# Patient Record
Sex: Male | Born: 1940 | Race: White | Hispanic: No | Marital: Married | Smoking: Former smoker
Health system: Southern US, Community
[De-identification: ages and names within clinical notes are randomized; demographics above are authoritative.]

## PROBLEM LIST (undated history)

## (undated) ENCOUNTER — Emergency Department: Admission: EM | Disposition: A | Discharge status: Home / Self Care | Payer: Medicare Other

## (undated) DIAGNOSIS — I10 Essential (primary) hypertension: Secondary | ICD-10-CM

## (undated) DIAGNOSIS — I509 Heart failure, unspecified: Secondary | ICD-10-CM

## (undated) DIAGNOSIS — J449 Chronic obstructive pulmonary disease, unspecified: Secondary | ICD-10-CM

## (undated) DIAGNOSIS — E119 Type 2 diabetes mellitus without complications: Secondary | ICD-10-CM

## (undated) DIAGNOSIS — N289 Disorder of kidney and ureter, unspecified: Secondary | ICD-10-CM

## (undated) DIAGNOSIS — E785 Hyperlipidemia, unspecified: Secondary | ICD-10-CM

## (undated) HISTORY — PX: CATARACT EXTRACTION: SUR2

## (undated) HISTORY — PX: WRIST SURGERY: SHX841

## (undated) HISTORY — PX: ROTATOR CUFF REPAIR: SHX139

## (undated) HISTORY — PX: HAND SURGERY: SHX662

## (undated) HISTORY — PX: CORONARY STENT PLACEMENT: SHX1402

## (undated) HISTORY — PX: REPLACEMENT TOTAL KNEE: SUR1224

---

## 2009-10-29 ENCOUNTER — Inpatient Hospital Stay: Admission: AD | Admit: 2009-10-29 | Discharge: 2009-11-02 | Payer: Self-pay | Admitting: Internal Medicine

## 2009-11-02 ENCOUNTER — Emergency Department (HOSPITAL_COMMUNITY): Admission: EM | Admit: 2009-11-02 | Discharge: 2009-11-02 | Payer: Self-pay | Admitting: Emergency Medicine

## 2010-10-06 LAB — URINALYSIS, ROUTINE W REFLEX MICROSCOPIC
Bilirubin Urine: NEGATIVE
Glucose, UA: NEGATIVE mg/dL
Ketones, ur: NEGATIVE mg/dL
Leukocytes, UA: NEGATIVE
Nitrite: NEGATIVE
Specific Gravity, Urine: 1.02 (ref 1.005–1.030)
Urobilinogen, UA: 0.2 mg/dL (ref 0.0–1.0)
pH: 6 (ref 5.0–8.0)

## 2010-10-06 LAB — COMPREHENSIVE METABOLIC PANEL
BUN: 35 mg/dL — ABNORMAL HIGH (ref 6–23)
CO2: 27 mEq/L (ref 19–32)
Chloride: 106 mEq/L (ref 96–112)
Creatinine, Ser: 3.13 mg/dL — ABNORMAL HIGH (ref 0.4–1.5)
GFR calc Af Amer: 24 mL/min — ABNORMAL LOW (ref >60)
GFR calc non Af Amer: 20 mL/min — ABNORMAL LOW (ref >60)
Potassium: 4.6 mEq/L (ref 3.5–5.1)
Sodium: 139 mEq/L (ref 135–145)
Total Protein: 7.5 g/dL (ref 6.0–8.3)

## 2010-10-06 LAB — CBC
HCT: 27.4 % — ABNORMAL LOW (ref 39.0–52.0)
MCHC: 34.8 g/dL (ref 30.0–36.0)
Platelets: 269 10*3/uL (ref 150–400)
RDW: 14.8 % (ref 11.5–15.5)

## 2010-10-06 LAB — CULTURE, BLOOD (ROUTINE X 2)
Culture: NO GROWTH
Report Status: 4222011

## 2010-10-06 LAB — GLUCOSE, CAPILLARY
Glucose-Capillary: 111 mg/dL — ABNORMAL HIGH (ref 70–99)
Glucose-Capillary: 138 mg/dL — ABNORMAL HIGH (ref 70–99)
Glucose-Capillary: 155 mg/dL — ABNORMAL HIGH (ref 70–99)
Glucose-Capillary: 155 mg/dL — ABNORMAL HIGH (ref 70–99)

## 2010-10-06 LAB — POCT CARDIAC MARKERS
Myoglobin, poc: >500 ng/mL (ref 12–200)
Troponin i, poc: <0.05 ng/mL (ref 0.00–0.09)
Troponin i, poc: <0.05 ng/mL (ref 0.00–0.09)

## 2010-10-06 LAB — DIFFERENTIAL
Basophils Absolute: 0.1 10*3/uL (ref 0.0–0.1)
Eosinophils Absolute: 0.3 10*3/uL (ref 0.0–0.7)
Lymphocytes Relative: 9 % — ABNORMAL LOW (ref 12–46)
Lymphs Abs: 0.8 10*3/uL (ref 0.7–4.0)
Monocytes Absolute: 0.8 10*3/uL (ref 0.1–1.0)
Neutrophils Relative %: 77 % (ref 43–77)

## 2010-10-06 LAB — URINE CULTURE: Colony Count: 10000

## 2010-10-06 LAB — BRAIN NATRIURETIC PEPTIDE: Pro B Natriuretic peptide (BNP): 358 pg/mL — ABNORMAL HIGH (ref 0.0–100.0)

## 2010-10-06 LAB — URINE MICROSCOPIC-ADD ON

## 2010-10-07 LAB — GLUCOSE, CAPILLARY: Glucose-Capillary: 154 mg/dL — ABNORMAL HIGH (ref 70–99)

## 2011-05-03 IMAGING — NM NM PULM PERFUSION & VENT (REBREATHING & WASHOUT)
2 series · 12 of 12 positions shown · non-contrast
Comparison: Chest radiography same day

CLINICAL DATA: Short of breath.  Elevated D-dimer.

NUCLEAR MEDICINE VENTILATION - PERFUSION LUNG SCAN
TECHNIQUE: Wash-in, equilibrium, and wash-out phase ventilation
images were obtained using 7e-VSS gas.  Perfusion images were
obtained in multiple projections after intravenous injection of Tc-
99m MAA.
Radiopharmaceuticals:  11.9 mCi 7e-VSS gas and 5.5 mCi Lc-IIm MAA.

[Series 1: lung vq · 3.20mm/px · 6 of 16 frames shown (1 of 2)]
[frame 2/16  full-range]
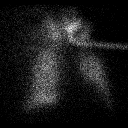
[frame 4/16  full-range]
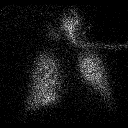
[frame 7/16  full-range]
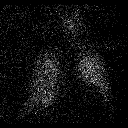
[frame 10/16]
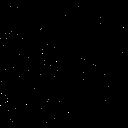
[frame 12/16  full-range]
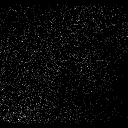
[frame 15/16]
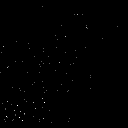

[Series 1: lung vq · 3.20mm/px · 6 of 16 frames shown (2 of 2)]
[frame 2/16  full-range]
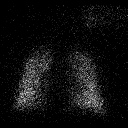
[frame 4/16  full-range]
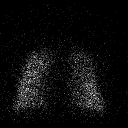
[frame 7/16]
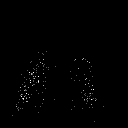
[frame 10/16]
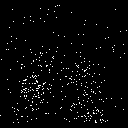
[frame 12/16]
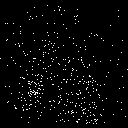
[frame 15/16]
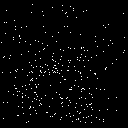

[12 of 12 positions shown; findings below may reference images not displayed]

FINDINGS: No air trapping.  There are no frank perfusion defects.
There is a minimal patchy pattern in the perihilar regions,
consistent with the areas of infiltrate.
IMPRESSION: Low probability of pulmonary embolism.  No frank defects.  Minimal
patchy pattern in the perihilar regions, matching areas of patchy
infiltrate at radiography.

## 2014-11-05 ENCOUNTER — Inpatient Hospital Stay (HOSPITAL_COMMUNITY)
Admission: EM | Admit: 2014-11-05 | Discharge: 2014-11-09 | DRG: 186 | Disposition: A | Discharge status: Home / Self Care | Payer: Medicare Other | Attending: Internal Medicine | Admitting: Internal Medicine

## 2014-11-05 ENCOUNTER — Emergency Department (HOSPITAL_COMMUNITY): Payer: Medicare Other

## 2014-11-05 ENCOUNTER — Encounter (HOSPITAL_COMMUNITY): Payer: Self-pay | Admitting: Emergency Medicine

## 2014-11-05 DIAGNOSIS — J441 Chronic obstructive pulmonary disease with (acute) exacerbation: Secondary | ICD-10-CM | POA: Diagnosis present

## 2014-11-05 DIAGNOSIS — Z87891 Personal history of nicotine dependence: Secondary | ICD-10-CM

## 2014-11-05 DIAGNOSIS — Z96652 Presence of left artificial knee joint: Secondary | ICD-10-CM | POA: Diagnosis present

## 2014-11-05 DIAGNOSIS — E1140 Type 2 diabetes mellitus with diabetic neuropathy, unspecified: Secondary | ICD-10-CM | POA: Diagnosis present

## 2014-11-05 DIAGNOSIS — J90 Pleural effusion, not elsewhere classified: Secondary | ICD-10-CM | POA: Diagnosis not present

## 2014-11-05 DIAGNOSIS — N186 End stage renal disease: Secondary | ICD-10-CM | POA: Diagnosis present

## 2014-11-05 DIAGNOSIS — I120 Hypertensive chronic kidney disease with stage 5 chronic kidney disease or end stage renal disease: Secondary | ICD-10-CM | POA: Diagnosis present

## 2014-11-05 DIAGNOSIS — E785 Hyperlipidemia, unspecified: Secondary | ICD-10-CM | POA: Diagnosis present

## 2014-11-05 DIAGNOSIS — M4802 Spinal stenosis, cervical region: Secondary | ICD-10-CM | POA: Diagnosis present

## 2014-11-05 DIAGNOSIS — F329 Major depressive disorder, single episode, unspecified: Secondary | ICD-10-CM | POA: Diagnosis present

## 2014-11-05 DIAGNOSIS — E211 Secondary hyperparathyroidism, not elsewhere classified: Secondary | ICD-10-CM | POA: Diagnosis present

## 2014-11-05 DIAGNOSIS — J9621 Acute and chronic respiratory failure with hypoxia: Secondary | ICD-10-CM | POA: Diagnosis present

## 2014-11-05 DIAGNOSIS — Z9849 Cataract extraction status, unspecified eye: Secondary | ICD-10-CM

## 2014-11-05 DIAGNOSIS — Z7901 Long term (current) use of anticoagulants: Secondary | ICD-10-CM

## 2014-11-05 DIAGNOSIS — Z992 Dependence on renal dialysis: Secondary | ICD-10-CM

## 2014-11-05 DIAGNOSIS — L97509 Non-pressure chronic ulcer of other part of unspecified foot with unspecified severity: Secondary | ICD-10-CM | POA: Diagnosis present

## 2014-11-05 DIAGNOSIS — R0602 Shortness of breath: Secondary | ICD-10-CM | POA: Diagnosis present

## 2014-11-05 DIAGNOSIS — D638 Anemia in other chronic diseases classified elsewhere: Secondary | ICD-10-CM | POA: Diagnosis present

## 2014-11-05 DIAGNOSIS — Z888 Allergy status to other drugs, medicaments and biological substances status: Secondary | ICD-10-CM

## 2014-11-05 DIAGNOSIS — I509 Heart failure, unspecified: Secondary | ICD-10-CM | POA: Diagnosis present

## 2014-11-05 DIAGNOSIS — Z9861 Coronary angioplasty status: Secondary | ICD-10-CM

## 2014-11-05 DIAGNOSIS — K219 Gastro-esophageal reflux disease without esophagitis: Secondary | ICD-10-CM | POA: Diagnosis present

## 2014-11-05 DIAGNOSIS — Z9981 Dependence on supplemental oxygen: Secondary | ICD-10-CM

## 2014-11-05 DIAGNOSIS — E8770 Fluid overload, unspecified: Secondary | ICD-10-CM | POA: Diagnosis present

## 2014-11-05 DIAGNOSIS — J9620 Acute and chronic respiratory failure, unspecified whether with hypoxia or hypercapnia: Secondary | ICD-10-CM

## 2014-11-05 DIAGNOSIS — E669 Obesity, unspecified: Secondary | ICD-10-CM | POA: Diagnosis present

## 2014-11-05 DIAGNOSIS — I878 Other specified disorders of veins: Secondary | ICD-10-CM | POA: Diagnosis present

## 2014-11-05 DIAGNOSIS — E119 Type 2 diabetes mellitus without complications: Secondary | ICD-10-CM

## 2014-11-05 DIAGNOSIS — E1121 Type 2 diabetes mellitus with diabetic nephropathy: Secondary | ICD-10-CM | POA: Diagnosis present

## 2014-11-05 DIAGNOSIS — Z9889 Other specified postprocedural states: Secondary | ICD-10-CM

## 2014-11-05 DIAGNOSIS — Z794 Long term (current) use of insulin: Secondary | ICD-10-CM

## 2014-11-05 HISTORY — DX: Type 2 diabetes mellitus without complications: E11.9

## 2014-11-05 HISTORY — DX: Heart failure, unspecified: I50.9

## 2014-11-05 HISTORY — DX: Hyperlipidemia, unspecified: E78.5

## 2014-11-05 HISTORY — DX: Disorder of kidney and ureter, unspecified: N28.9

## 2014-11-05 HISTORY — DX: Essential (primary) hypertension: I10

## 2014-11-05 HISTORY — DX: Chronic obstructive pulmonary disease, unspecified: J44.9

## 2014-11-05 NOTE — ED Notes (Signed)
Pt is from home, pt reports he was not able to complete dialysis yesterday but was suppose to return today but was not able to get a ride to go today. Pt reports SOB, administered breathing treatments at home but did not have relief. Pt is suppose to wear 2L 02 at home always, pt is not compliant.

## 2014-11-05 NOTE — ED Provider Notes (Signed)
CSN: [id number]     Arrival date & time 11/05/14  2333 History  This chart was scribed for Loren Racer, MD by Bronson Curb, ED Scribe. This patient was seen in room D31C/D31C and the patient's care was started at 12:00 AM.   Chief Complaint  Patient presents with  . Shortness of Breath    The history is provided by the patient. No language interpreter was used.     HPI Comments: Adam Gates is a 74 y.o. male, with history of DM, renal disorder, and COPD, who presents to the Emergency Department complaining of increased SOB for the past 2 days. Patient states he went to dialysis 2 days ago, however, he was not able to undergo the full treatment due to increased cramping and reports he "left 8 pounds up". He states he was supposed to return yesterday to continue treatmentbut did not have transportation. He reports increased SOB when lying flat and with physical exertion. There is chronic swelling to the BLE. He denies any new pain at this time. He reports history of irregular heartbeat for several years. He denies abdominal pain. Patient suspects his symptoms are related to increased fluid and is requesting dialysis treatment.  PCP: Deforest Hoyles, MD    Past Medical History  Diagnosis Date  . COPD (chronic obstructive pulmonary disease)   . Diabetes mellitus without complication   . Renal disorder   . Hypertension   . Hyperlipidemia   . CHF (congestive heart failure)    Past Surgical History  Procedure Laterality Date  . Hand surgery    . Replacement total knee Left   . Coronary stent placement    . Rotator cuff repair Left   . Cataract extraction    . Wrist surgery Right    No family history on file. History  Substance Use Topics  . Smoking status: Former Games developer  . Smokeless tobacco: Not on file  . Alcohol Use: No    Review of Systems  Constitutional: Negative for fever and chills.  Respiratory: Positive for shortness of breath. Negative for cough and wheezing.    Cardiovascular: Positive for leg swelling. Negative for chest pain and palpitations.  Gastrointestinal: Negative for nausea, vomiting, abdominal pain, diarrhea and constipation.  Musculoskeletal: Negative for myalgias, back pain, neck pain and neck stiffness.  Skin: Negative for rash and wound.  Neurological: Negative for dizziness, weakness, light-headedness, numbness and headaches.  All other systems reviewed and are negative.     Allergies  Ambien; Gabapentin; Lunesta; and Norvasc  Home Medications   Prior to Admission medications   Medication Sig Start Date End Date Taking? Authorizing Provider  albuterol (PROVENTIL) (2.5 MG/3ML) 0.083% nebulizer solution Take 2.5 mg by nebulization 4 (four) times daily.   Yes Historical Provider, MD  carvedilol (COREG) 6.25 MG tablet Take 6.25 mg by mouth 2 (two) times daily with a meal.   Yes Historical Provider, MD  donepezil (ARICEPT) 10 MG tablet Take 10 mg by mouth at bedtime.   Yes Historical Provider, MD  HYDROcodone-acetaminophen (NORCO) 10-325 MG per tablet Take 1 tablet by mouth every 4 (four) hours as needed for moderate pain.   Yes Historical Provider, MD  insulin NPH-regular Human (NOVOLIN 70/30) (70-30) 100 UNIT/ML injection Inject 0-60 Units into the skin 2 (two) times daily.   Yes Historical Provider, MD  morphine (MSIR) 30 MG tablet Take 30-60 mg by mouth every 4 (four) hours as needed for severe pain.   Yes Historical Provider, MD  omeprazole (PRILOSEC) 40  MG capsule Take 40 mg by mouth every morning.   Yes Historical Provider, MD  sertraline (ZOLOFT) 50 MG tablet Take 50 mg by mouth daily.   Yes Historical Provider, MD   Triage Vitals: BP 158/84 mmHg  Temp(Src) 98.1 F (36.7 C) (Oral)  Resp 22  Ht  (1.753 m)  Wt 260 lb (117.935 kg)  BMI 38.38 kg/m2  SpO2 100%  Physical Exam  Constitutional: He is oriented to person, place, and time. He appears well-developed and well-nourished. No distress.  HENT:  Head:  Normocephalic and atraumatic.  Mouth/Throat: Oropharynx is clear and moist.  Eyes: Conjunctivae and EOM are normal. Pupils are equal, round, and reactive to light.  Neck: Normal range of motion. Neck supple. No tracheal deviation present.  Cardiovascular: Normal rate and regular rhythm.   Pulmonary/Chest: Effort normal and breath sounds normal. No respiratory distress. He has no wheezes. He has no rales.  Decreased breath sounds bilateral bases with scattered rhonchi.  Abdominal: Soft. Bowel sounds are normal. He exhibits no distension and no mass. There is no tenderness. There is no rebound and no guarding.  Musculoskeletal: Normal range of motion. He exhibits no edema or tenderness.  Palpable thrill and right upper extremity AV fistula. Bilateral chronic appearing lower extremity edema.  Neurological: He is alert and oriented to person, place, and time.  Moves all extremities without deficit.  Skin: Skin is warm and dry. No rash noted. No erythema.  Psychiatric: He has a normal mood and affect. His behavior is normal.  Nursing note and vitals reviewed.   ED Course  Procedures (including critical care time)  DIAGNOSTIC STUDIES: Oxygen Saturation is 100% on 2L/min [place], normal by my interpretation.    COORDINATION OF CARE: At 0006 Discussed treatment plan with patient which includes imaging and dialysis. Patient agrees.   Labs Review Labs Reviewed  COMPREHENSIVE METABOLIC PANEL - Abnormal; Notable for the following:    Sodium 134 (*)    Glucose, Bld 156 (*)    BUN 52 (*)    Creatinine, Ser 6.83 (*)    Albumin 3.2 (*)    GFR calc non Af Amer 7 (*)    GFR calc Af Amer 8 (*)    All other components within normal limits  BRAIN NATRIURETIC PEPTIDE - Abnormal; Notable for the following:    B Natriuretic Peptide 1230.8 (*)    All other components within normal limits  CBC WITH DIFFERENTIAL/PLATELET - Abnormal; Notable for the following:    RBC 3.06 (*)    Hemoglobin 8.6 (*)    HCT  27.6 (*)    Platelets 136 (*)    Neutrophils Relative % 85 (*)    Neutro Abs 7.9 (*)    Lymphocytes Relative 7 (*)    Lymphs Abs 0.6 (*)    All other components within normal limits  I-STAT TROPOININ, ED    Imaging Review Ct Chest W Contrast  11/06/2014   CLINICAL DATA:  Increase shortness of breath. Dialysis which has been missed over the past few days.  EXAM: CT CHEST WITH CONTRAST  TECHNIQUE: Multidetector CT imaging of the chest was performed during intravenous contrast administration.  CONTRAST:  75mL OMNIPAQUE IOHEXOL 300 MG/ML  SOLN  COMPARISON:  Chest x-ray from 1 day prior  FINDINGS: THORACIC INLET/BODY WALL:  No acute abnormality.  MEDIASTINUM:  Mild cardiomegaly. No pericardial effusion. There is extensive atherosclerosis, including the coronary arteries. Moderate aortic valvular calcifications/sclerosis. No acute vascular findings. No suspicious lymph nodes. Negative esophagus.  LUNG WINDOWS:  There is a moderate left pleural effusion which is loculated along the lateral wall. Septations are faintly visible posteriorly and there is mild, intermittent pleural thickening. There is a bandlike opacity in the lower lingula with volume loss and segmental airway opacification. No definitive pneumonia. There is a trace right pleural effusion which is layering.  UPPER ABDOMEN:  No acute findings.  OSSEOUS:  No acute fracture.  No suspicious lytic or blastic lesions.  IMPRESSION: 1. Moderate, loculated and partially septated left pleural effusion. No definitive cause identified, consider sampling. 2. Inferior lingula atelectasis with segmental airway opacification or obstruction. No mass is seen, but close CT follow-up is recommend given #1. 3. Moderate aortic valve calcifications/sclerosis.   Electronically Signed   By: Marnee SpringJonathon  Watts M.D.   On: 11/06/2014 02:24   Dg Chest Port 1 View  11/06/2014   CLINICAL DATA:  Shortness of breath.  EXAM: PORTABLE CHEST - 1 VIEW  COMPARISON:  05/31/2014   FINDINGS: There is a moderate left pleural effusion which does not appear free-flowing.  Chronic cardiomegaly. No pulmonary edema in the non obscured lungs. No pneumothorax.  IMPRESSION: 1. Moderate left pleural effusion which appears partly loculated. 2. Chronic cardiomegaly.   Electronically Signed   By: Marnee SpringJonathon  Watts M.D.   On: 11/06/2014 00:10      EKG Interpretation   Date/Time:  Tuesday November 05 2014 23:45:09 EDT Ventricular Rate:  87 PR Interval:    QRS Duration: 106 QT Interval:  386 QTC Calculation: 464 R Axis:   -99 Text Interpretation:  Atrial fibrillation Incomplete RBBB and LAFB  Anterior infarct, old Confirmed by Ranae PalmsYELVERTON  MD, Konner Saiz ([id number]) on  11/06/2014 12:21:46 AM      MDM   Final diagnoses:  SOB (shortness of breath)    I personally performed the services described in this documentation, which was scribed in my presence. The recorded information has been reviewed and is accurate.  Discussed with Hospitalist and will admit.   Loren Raceravid Covey Baller, MD 11/06/14 [phone]

## 2014-11-06 ENCOUNTER — Inpatient Hospital Stay (HOSPITAL_COMMUNITY): Payer: Medicare Other

## 2014-11-06 ENCOUNTER — Emergency Department (HOSPITAL_COMMUNITY): Payer: Medicare Other

## 2014-11-06 ENCOUNTER — Encounter (HOSPITAL_COMMUNITY): Payer: Self-pay | Admitting: Emergency Medicine

## 2014-11-06 DIAGNOSIS — E119 Type 2 diabetes mellitus without complications: Secondary | ICD-10-CM

## 2014-11-06 DIAGNOSIS — I120 Hypertensive chronic kidney disease with stage 5 chronic kidney disease or end stage renal disease: Secondary | ICD-10-CM | POA: Diagnosis present

## 2014-11-06 DIAGNOSIS — Z9849 Cataract extraction status, unspecified eye: Secondary | ICD-10-CM | POA: Diagnosis not present

## 2014-11-06 DIAGNOSIS — I878 Other specified disorders of veins: Secondary | ICD-10-CM | POA: Diagnosis present

## 2014-11-06 DIAGNOSIS — N189 Chronic kidney disease, unspecified: Secondary | ICD-10-CM

## 2014-11-06 DIAGNOSIS — Z992 Dependence on renal dialysis: Secondary | ICD-10-CM | POA: Diagnosis not present

## 2014-11-06 DIAGNOSIS — E785 Hyperlipidemia, unspecified: Secondary | ICD-10-CM | POA: Diagnosis present

## 2014-11-06 DIAGNOSIS — E8779 Other fluid overload: Secondary | ICD-10-CM

## 2014-11-06 DIAGNOSIS — K219 Gastro-esophageal reflux disease without esophagitis: Secondary | ICD-10-CM | POA: Diagnosis present

## 2014-11-06 DIAGNOSIS — R0602 Shortness of breath: Secondary | ICD-10-CM | POA: Diagnosis present

## 2014-11-06 DIAGNOSIS — G894 Chronic pain syndrome: Secondary | ICD-10-CM | POA: Diagnosis not present

## 2014-11-06 DIAGNOSIS — Z87891 Personal history of nicotine dependence: Secondary | ICD-10-CM | POA: Diagnosis not present

## 2014-11-06 DIAGNOSIS — Z96652 Presence of left artificial knee joint: Secondary | ICD-10-CM | POA: Diagnosis present

## 2014-11-06 DIAGNOSIS — E1169 Type 2 diabetes mellitus with other specified complication: Secondary | ICD-10-CM | POA: Diagnosis not present

## 2014-11-06 DIAGNOSIS — J90 Pleural effusion, not elsewhere classified: Secondary | ICD-10-CM | POA: Diagnosis present

## 2014-11-06 DIAGNOSIS — E8770 Fluid overload, unspecified: Secondary | ICD-10-CM | POA: Diagnosis present

## 2014-11-06 DIAGNOSIS — Z888 Allergy status to other drugs, medicaments and biological substances status: Secondary | ICD-10-CM | POA: Diagnosis not present

## 2014-11-06 DIAGNOSIS — E1121 Type 2 diabetes mellitus with diabetic nephropathy: Secondary | ICD-10-CM | POA: Diagnosis present

## 2014-11-06 DIAGNOSIS — L97509 Non-pressure chronic ulcer of other part of unspecified foot with unspecified severity: Secondary | ICD-10-CM | POA: Diagnosis present

## 2014-11-06 DIAGNOSIS — E11319 Type 2 diabetes mellitus with unspecified diabetic retinopathy without macular edema: Secondary | ICD-10-CM | POA: Diagnosis not present

## 2014-11-06 DIAGNOSIS — Z9981 Dependence on supplemental oxygen: Secondary | ICD-10-CM | POA: Diagnosis not present

## 2014-11-06 DIAGNOSIS — Z7901 Long term (current) use of anticoagulants: Secondary | ICD-10-CM | POA: Diagnosis not present

## 2014-11-06 DIAGNOSIS — E1129 Type 2 diabetes mellitus with other diabetic kidney complication: Secondary | ICD-10-CM

## 2014-11-06 DIAGNOSIS — E1140 Type 2 diabetes mellitus with diabetic neuropathy, unspecified: Secondary | ICD-10-CM | POA: Diagnosis present

## 2014-11-06 DIAGNOSIS — M4802 Spinal stenosis, cervical region: Secondary | ICD-10-CM | POA: Diagnosis present

## 2014-11-06 DIAGNOSIS — J441 Chronic obstructive pulmonary disease with (acute) exacerbation: Secondary | ICD-10-CM | POA: Diagnosis present

## 2014-11-06 DIAGNOSIS — J9621 Acute and chronic respiratory failure with hypoxia: Secondary | ICD-10-CM | POA: Diagnosis present

## 2014-11-06 DIAGNOSIS — Z794 Long term (current) use of insulin: Secondary | ICD-10-CM | POA: Diagnosis not present

## 2014-11-06 DIAGNOSIS — I509 Heart failure, unspecified: Secondary | ICD-10-CM | POA: Diagnosis present

## 2014-11-06 DIAGNOSIS — E669 Obesity, unspecified: Secondary | ICD-10-CM | POA: Diagnosis present

## 2014-11-06 DIAGNOSIS — F329 Major depressive disorder, single episode, unspecified: Secondary | ICD-10-CM | POA: Diagnosis present

## 2014-11-06 DIAGNOSIS — Z9861 Coronary angioplasty status: Secondary | ICD-10-CM | POA: Diagnosis not present

## 2014-11-06 DIAGNOSIS — N186 End stage renal disease: Secondary | ICD-10-CM | POA: Diagnosis present

## 2014-11-06 DIAGNOSIS — D638 Anemia in other chronic diseases classified elsewhere: Secondary | ICD-10-CM | POA: Diagnosis present

## 2014-11-06 DIAGNOSIS — E1122 Type 2 diabetes mellitus with diabetic chronic kidney disease: Secondary | ICD-10-CM

## 2014-11-06 DIAGNOSIS — E211 Secondary hyperparathyroidism, not elsewhere classified: Secondary | ICD-10-CM | POA: Diagnosis present

## 2014-11-06 LAB — BODY FLUID CELL COUNT WITH DIFFERENTIAL
EOS FL: NONE SEEN %
LYMPHS FL: 58 %
Monocyte-Macrophage-Serous Fluid: 7 % — ABNORMAL LOW (ref 50–90)
Neutrophil Count, Fluid: 35 % — ABNORMAL HIGH (ref 0–25)
Total Nucleated Cell Count, Fluid: 642 cu mm (ref 0–1000)

## 2014-11-06 LAB — CBC WITH DIFFERENTIAL/PLATELET
BASOS PCT: 0 % (ref 0–1)
Basophils Absolute: 0 10*3/uL (ref 0.0–0.1)
Eosinophils Absolute: 0.1 10*3/uL (ref 0.0–0.7)
Eosinophils Relative: 1 % (ref 0–5)
HEMATOCRIT: 27.6 % — AB (ref 39.0–52.0)
Hemoglobin: 8.6 g/dL — ABNORMAL LOW (ref 13.0–17.0)
Lymphocytes Relative: 7 % — ABNORMAL LOW (ref 12–46)
Lymphs Abs: 0.6 10*3/uL — ABNORMAL LOW (ref 0.7–4.0)
MCH: 28.1 pg (ref 26.0–34.0)
MCHC: 31.2 g/dL (ref 30.0–36.0)
MCV: 90.2 fL (ref 78.0–100.0)
MONO ABS: 0.6 10*3/uL (ref 0.1–1.0)
MONOS PCT: 7 % (ref 3–12)
NEUTROS ABS: 7.9 10*3/uL — AB (ref 1.7–7.7)
Neutrophils Relative %: 85 % — ABNORMAL HIGH (ref 43–77)
Platelets: 136 10*3/uL — ABNORMAL LOW (ref 150–400)
RBC: 3.06 MIL/uL — ABNORMAL LOW (ref 4.22–5.81)
RDW: 15.4 % (ref 11.5–15.5)
WBC: 9.1 10*3/uL (ref 4.0–10.5)

## 2014-11-06 LAB — PROTEIN, BODY FLUID: Total protein, fluid: 3.5 g/dL

## 2014-11-06 LAB — I-STAT TROPONIN, ED: Troponin i, poc: 0.05 ng/mL (ref 0.00–0.08)

## 2014-11-06 LAB — COMPREHENSIVE METABOLIC PANEL
ALBUMIN: 3.2 g/dL — AB (ref 3.5–5.2)
ALT: 14 U/L (ref 0–53)
ANION GAP: 13 (ref 5–15)
AST: 18 U/L (ref 0–37)
Alkaline Phosphatase: 58 U/L (ref 39–117)
BILIRUBIN TOTAL: 0.5 mg/dL (ref 0.3–1.2)
BUN: 52 mg/dL — AB (ref 6–23)
CO2: 21 mmol/L (ref 19–32)
CREATININE: 6.83 mg/dL — AB (ref 0.50–1.35)
Calcium: 8.7 mg/dL (ref 8.4–10.5)
Chloride: 100 mmol/L (ref 96–112)
GFR calc non Af Amer: 7 mL/min — ABNORMAL LOW (ref >90)
GFR, EST AFRICAN AMERICAN: 8 mL/min — AB (ref >90)
Glucose, Bld: 156 mg/dL — ABNORMAL HIGH (ref 70–99)
Potassium: 4.6 mmol/L (ref 3.5–5.1)
Sodium: 134 mmol/L — ABNORMAL LOW (ref 135–145)
Total Protein: 6.6 g/dL (ref 6.0–8.3)

## 2014-11-06 LAB — GLUCOSE, CAPILLARY
GLUCOSE-CAPILLARY: 120 mg/dL — AB (ref 70–99)
Glucose-Capillary: 119 mg/dL — ABNORMAL HIGH (ref 70–99)
Glucose-Capillary: 90 mg/dL (ref 70–99)

## 2014-11-06 LAB — BRAIN NATRIURETIC PEPTIDE: B NATRIURETIC PEPTIDE 5: 1230.8 pg/mL — AB (ref 0.0–100.0)

## 2014-11-06 LAB — GLUCOSE, SEROUS FLUID: GLUCOSE FL: 107 mg/dL

## 2014-11-06 LAB — PH, BODY FLUID: pH, Fluid: 8

## 2014-11-06 LAB — LACTATE DEHYDROGENASE, PLEURAL OR PERITONEAL FLUID: LD, Fluid: 213 U/L — ABNORMAL HIGH (ref 3–23)

## 2014-11-06 LAB — MRSA PCR SCREENING: MRSA by PCR: NEGATIVE

## 2014-11-06 LAB — AMYLASE, PLEURAL FLUID: AMYLASE, PLEURAL FLUID: 19 U/L

## 2014-11-06 MED ORDER — INSULIN ASPART PROT & ASPART (70-30 MIX) 100 UNIT/ML ~~LOC~~ SUSP
28.0000 [IU] | Freq: Two times a day (BID) | SUBCUTANEOUS | Status: DC
  Filled 2014-11-06: qty 10

## 2014-11-06 MED ORDER — ALBUTEROL SULFATE (2.5 MG/3ML) 0.083% IN NEBU: 2.5000 mg | INHALATION_SOLUTION | Freq: Four times a day (QID) | RESPIRATORY_TRACT | Status: DC

## 2014-11-06 MED ORDER — CARVEDILOL 6.25 MG PO TABS
6.2500 mg | ORAL_TABLET | Freq: Two times a day (BID) | ORAL | Status: DC
  Administered 2014-11-06 – 2014-11-07 (×3): 6.25 mg via ORAL
  Filled 2014-11-06 (×7): qty 1

## 2014-11-06 MED ORDER — HEPARIN SODIUM (PORCINE) 5000 UNIT/ML IJ SOLN
5000.0000 [IU] | Freq: Three times a day (TID) | INTRAMUSCULAR | Status: DC
  Administered 2014-11-06 – 2014-11-09 (×11): 5000 [IU] via SUBCUTANEOUS
  Filled 2014-11-06 (×9): qty 1

## 2014-11-06 MED ORDER — PANTOPRAZOLE SODIUM 40 MG PO TBEC
80.0000 mg | DELAYED_RELEASE_TABLET | Freq: Every day | ORAL | Status: DC
  Administered 2014-11-06 – 2014-11-09 (×4): 80 mg via ORAL
  Filled 2014-11-06 (×4): qty 2

## 2014-11-06 MED ORDER — MORPHINE SULFATE 4 MG/ML IJ SOLN
4.0000 mg | Freq: Once | INTRAMUSCULAR | Status: AC
  Administered 2014-11-06: 4 mg via INTRAVENOUS
  Filled 2014-11-06: qty 1

## 2014-11-06 MED ORDER — HYDROCODONE-ACETAMINOPHEN 10-325 MG PO TABS
1.0000 | ORAL_TABLET | ORAL | Status: DC | PRN
  Administered 2014-11-06 – 2014-11-08 (×4): 1 via ORAL
  Filled 2014-11-06 (×4): qty 1

## 2014-11-06 MED ORDER — DONEPEZIL HCL 10 MG PO TABS
10.0000 mg | ORAL_TABLET | Freq: Every day | ORAL | Status: DC
  Administered 2014-11-06 – 2014-11-08 (×3): 10 mg via ORAL
  Filled 2014-11-06 (×4): qty 1

## 2014-11-06 MED ORDER — CETYLPYRIDINIUM CHLORIDE 0.05 % MT LIQD
7.0000 mL | Freq: Two times a day (BID) | OROMUCOSAL | Status: DC
  Administered 2014-11-06 – 2014-11-09 (×6): 7 mL via OROMUCOSAL

## 2014-11-06 MED ORDER — INSULIN ASPART PROT & ASPART (70-30 MIX) 100 UNIT/ML ~~LOC~~ SUSP
26.0000 [IU] | Freq: Two times a day (BID) | SUBCUTANEOUS | Status: DC
  Administered 2014-11-06 – 2014-11-09 (×5): 26 [IU] via SUBCUTANEOUS
  Filled 2014-11-06: qty 10

## 2014-11-06 MED ORDER — LIDOCAINE HCL (PF) 1 % IJ SOLN
INTRAMUSCULAR | Status: AC
  Filled 2014-11-06: qty 10

## 2014-11-06 MED ORDER — INSULIN ASPART 100 UNIT/ML ~~LOC~~ SOLN: 0.0000 [IU] | Freq: Three times a day (TID) | SUBCUTANEOUS | Status: DC

## 2014-11-06 MED ORDER — IOHEXOL 300 MG/ML  SOLN
75.0000 mL | Freq: Once | INTRAMUSCULAR | Status: AC | PRN
  Administered 2014-11-06: 75 mL via INTRAVENOUS

## 2014-11-06 MED ORDER — MORPHINE SULFATE 15 MG PO TABS
30.0000 mg | ORAL_TABLET | ORAL | Status: DC | PRN
  Administered 2014-11-06 – 2014-11-08 (×7): 30 mg via ORAL
  Administered 2014-11-09: 60 mg via ORAL
  Filled 2014-11-06 (×4): qty 2
  Filled 2014-11-06: qty 4
  Filled 2014-11-06 (×3): qty 2

## 2014-11-06 MED ORDER — ALBUTEROL SULFATE (2.5 MG/3ML) 0.083% IN NEBU
2.5000 mg | INHALATION_SOLUTION | Freq: Four times a day (QID) | RESPIRATORY_TRACT | Status: DC
  Administered 2014-11-06 – 2014-11-09 (×12): 2.5 mg via RESPIRATORY_TRACT
  Filled 2014-11-06 (×13): qty 3

## 2014-11-06 MED ORDER — SERTRALINE HCL 50 MG PO TABS
50.0000 mg | ORAL_TABLET | Freq: Every day | ORAL | Status: DC
  Administered 2014-11-06 – 2014-11-09 (×4): 50 mg via ORAL
  Filled 2014-11-06 (×5): qty 1

## 2014-11-06 NOTE — Progress Notes (Signed)
Utilization review completed. Nazario Russom, RN, BSN. 

## 2014-11-06 NOTE — Progress Notes (Signed)
Patient seen and examined. Admitted after midnight secondary to worsening shortness of breath. Patient reports that his symptom has been present for the last 2 days and worsening. He is approximately 8 pounds up since his last hemodialysis. Chest x-ray demonstrated loculated left pleural effusion and on admission he was very tachypneic and requiring 2 L of oxygen supplementation. On my exam patient was status post thoracentesis(approximately 700 mL of pleural fluid was removed). Patient is feeling better and breathing easier. Please refer to H&P written by Dr. Julian ReilGardner for further info/details on admission.  Plan: -Continue hemodialysis Monday, Wednesday and Friday -follow chest x-ray in am -low sodium/renal diet -will start 70/30 26 units BID, will check A1C and adjust insulin therapy as needed.  Adam Gates, Adam Gates [phone]

## 2014-11-06 NOTE — Procedures (Signed)
Successful US guided left thoracentesis. Yielded 700 ml of dark bloody fluid. Pt tolerated procedure well. No immediate complications.  Specimen was sent for labs. CXR ordered.  Pattricia BossMORGAN, Kratos Ruscitti D PA-C 11/06/2014 10:17 AM

## 2014-11-06 NOTE — Consult Note (Signed)
WOC wound consult note Reason for Consult: MASD and ulcers on the toes. Pt with severe venous stasis apparent based on hemosiderin staining bilaterally. Has some ulcerations but they are dry over the bilateral LE most are toe tip and some are medial on the RLE. He reports that he uses power chair and that he "bumps" his legs a lot.  He has sleeve like protectors for his ankles and calves that he reports he wears when he is up in his chair.  Wound type: Mild MASD at the apex of gluteal skin folds, blanchable  Toe tip ulcers left 2nd and 4th toes Medial malleolar and medial heel right foot Measurement: L 2nd toe: 0.5cm x 0.5cm 0.1cm distal; 0.3cm x0.3cm x0.1cm proximal  L 4th toe: 0.5cm x 1.0cm x 0.1cm  Left lateral heel: 0.5cm x 0.5cm x 0.1cm Left medial malleolar: 0.5cm x 0.5cm x0.1cm Wound bed: toe tips and legs are dry, pink, clean; gluteal cleft pink, blanchable not open Drainage (amount, consistency, odor) none Periwound: severe hemosiderin staining bilateral LEs  Dressing procedure/placement/frequency: No topical care to the toe tips or LE's, leave open to air.  Barrier cream and sacral prophylactic dressing to the gluteal cleft.   Discussed POC with patient and bedside nurse.  Re consult if needed, will not follow at this time. Thanks  Azrielle Springsteen [employer] RN, CWOCN [phone])

## 2014-11-06 NOTE — Progress Notes (Signed)
Pt vomited x 1, resp labored after O2 sat 100% cont pulse ox.  LS wheezes thoughout.  Repositioned pt resp better but still wheezing.  Breathing Tx given @ 0800.  Pt to have US guided thoracentesis. Paged Dr. Leonel RamsayMadera FYI

## 2014-11-06 NOTE — Progress Notes (Signed)
70/30 insulin 28-30 units no scale to go by, paged Dr. Gwenlyn PerkingMadera for order clarification.

## 2014-11-06 NOTE — H&P (Signed)
[facility] History and Physical  Adam Gates WGN:[id number] DOB: January 16, 1941 DOA: 11/05/2014  Referring physician: EDP PCP: Deforest HoylesGENTRY,DANIEL, MD   Chief Complaint: SOB   HPI: Adam Gates is a 74 y.o. male with h/o ESRD, dialysis MWF.  Patient presents to ED with c/o increased SOB from his baseline SOB over the past 2 days.  2 days ago at dialysis he was unable to complete a full session due to cramping and so he "left 8 pounds up".  He was supposed to return yesterday to dialysis he states to finish the treatment but didn't have transportation and so presents to the ED for what he believes is SOB due to fluid overload from dialysis.  Review of Systems: Systems reviewed.  As above, otherwise negative  Past Medical History  Diagnosis Date  . COPD (chronic obstructive pulmonary disease)   . Diabetes mellitus without complication   . Renal disorder   . Hypertension   . Hyperlipidemia   . CHF (congestive heart failure)    Past Surgical History  Procedure Laterality Date  . Hand surgery    . Replacement total knee Left   . Coronary stent placement    . Rotator cuff repair Left   . Cataract extraction    . Wrist surgery Right    Social History:  reports that he has quit smoking. He does not have any smokeless tobacco history on file. He reports that he does not drink alcohol. His drug history is not on file.  Allergies  Allergen Reactions  . Ambien [Zolpidem Tartrate] Other (See Comments)    Mental changes  . Gabapentin Swelling  . Lunesta [Eszopiclone] Other (See Comments)    Mental changes   . Norvasc [Amlodipine Besylate] Swelling    No family history on file.   Prior to Admission medications   Medication Sig Start Date End Date Taking? Authorizing Provider  albuterol (PROVENTIL) (2.5 MG/3ML) 0.083% nebulizer solution Take 2.5 mg by nebulization 4 (four) times daily.   Yes Historical Provider, MD  carvedilol (COREG) 6.25 MG tablet Take 6.25 mg by mouth 2 (two)  times daily with a meal.   Yes Historical Provider, MD  donepezil (ARICEPT) 10 MG tablet Take 10 mg by mouth at bedtime.   Yes Historical Provider, MD  HYDROcodone-acetaminophen (NORCO) 10-325 MG per tablet Take 1 tablet by mouth every 4 (four) hours as needed for moderate pain.   Yes Historical Provider, MD  insulin NPH-regular Human (NOVOLIN 70/30) (70-30) 100 UNIT/ML injection Inject 0-60 Units into the skin 2 (two) times daily.   Yes Historical Provider, MD  morphine (MSIR) 30 MG tablet Take 30-60 mg by mouth every 4 (four) hours as needed for severe pain.   Yes Historical Provider, MD  omeprazole (PRILOSEC) 40 MG capsule Take 40 mg by mouth every morning.   Yes Historical Provider, MD  sertraline (ZOLOFT) 50 MG tablet Take 50 mg by mouth daily.   Yes Historical Provider, MD   Physical Exam: Filed Vitals:   11/06/14 0207  BP: 155/63  Pulse: 80  Temp:   Resp: 18    BP 155/63 mmHg  Pulse 80  Temp(Src) 98.1 F (36.7 C) (Oral)  Resp 18  Ht 5\' 9"  (1.753 m)  Wt 117.935 kg (260 lb)  BMI 38.38 kg/m2  SpO2 99%  General Appearance:    Alert, oriented, no distress, appears stated age  Head:    Normocephalic, atraumatic  Eyes:    PERRL, EOMI, sclera non-icteric  Nose:   Nares without drainage or epistaxis. Mucosa, turbinates normal  Throat:   Moist mucous membranes. Oropharynx without erythema or exudate.  Neck:   Supple. No carotid bruits.  No thyromegaly.  No lymphadenopathy.   Back:     No CVA tenderness, no spinal tenderness  Lungs:     Few scattered rhonichi in right base, breath sounds diminished in left base.  Chest wall:    No tenderness to palpitation  Heart:    Regular rate and rhythm without murmurs, gallops, rubs  Abdomen:     Soft, non-tender, nondistended, normal bowel sounds, no organomegaly  Genitalia:    deferred  Rectal:    deferred  Extremities:   No clubbing, cyanosis, chronic skin changes and edema in BLE.  Pulses:   2+ and symmetric all extremities   Skin:   Skin color, texture, turgor normal, no rashes or lesions  Lymph nodes:   Cervical, supraclavicular, and axillary nodes normal  Neurologic:   CNII-XII intact. Normal strength, sensation and reflexes      throughout    Labs on Admission:  Basic Metabolic Panel:  Recent Labs Lab 11/06/14  NA 134*  K 4.6  CL 100  CO2 21  GLUCOSE 156*  BUN 52*  CREATININE 6.83*  CALCIUM 8.7   Liver Function Tests:  Recent Labs Lab 11/06/14  AST 18  ALT 14  ALKPHOS 58  BILITOT 0.5  PROT 6.6  ALBUMIN 3.2*   No results for input(s): LIPASE, AMYLASE in the last 168 hours. No results for input(s): AMMONIA in the last 168 hours. CBC:  Recent Labs Lab 11/06/14  WBC 9.1  NEUTROABS 7.9*  HGB 8.6*  HCT 27.6*  MCV 90.2  PLT 136*   Cardiac Enzymes: No results for input(s): CKTOTAL, CKMB, CKMBINDEX, TROPONINI in the last 168 hours.  BNP (last 3 results) No results for input(s): PROBNP in the last 8760 hours. CBG: No results for input(s): GLUCAP in the last 168 hours.  Radiological Exams on Admission: Ct Chest W Contrast  11/06/2014   CLINICAL DATA:  Increase shortness of breath. Dialysis which has been missed over the past few days.  EXAM: CT CHEST WITH CONTRAST  TECHNIQUE: Multidetector CT imaging of the chest was performed during intravenous contrast administration.  CONTRAST:  75mL OMNIPAQUE IOHEXOL 300 MG/ML  SOLN  COMPARISON:  Chest x-ray from 1 day prior  FINDINGS: THORACIC INLET/BODY WALL:  No acute abnormality.  MEDIASTINUM:  Mild cardiomegaly. No pericardial effusion. There is extensive atherosclerosis, including the coronary arteries. Moderate aortic valvular calcifications/sclerosis. No acute vascular findings. No suspicious lymph nodes. Negative esophagus.  LUNG WINDOWS:  There is a moderate left pleural effusion which is loculated along the lateral wall. Septations are faintly visible posteriorly and there is mild, intermittent pleural thickening. There is a bandlike opacity  in the lower lingula with volume loss and segmental airway opacification. No definitive pneumonia. There is a trace right pleural effusion which is layering.  UPPER ABDOMEN:  No acute findings.  OSSEOUS:  No acute fracture.  No suspicious lytic or blastic lesions.  IMPRESSION: 1. Moderate, loculated and partially septated left pleural effusion. No definitive cause identified, consider sampling. 2. Inferior lingula atelectasis with segmental airway opacification or obstruction. No mass is seen, but close CT follow-up is recommend given #1. 3. Moderate aortic valve calcifications/sclerosis.   Electronically Signed   By: Marnee Spring M.D.   On: 11/06/2014 02:24   Dg Chest Port 1 View  11/06/2014   CLINICAL DATA:  Shortness of breath.  EXAM: PORTABLE CHEST - 1 VIEW  COMPARISON:  05/31/2014  FINDINGS: There is a moderate left pleural effusion which does not appear free-flowing.  Chronic cardiomegaly. No pulmonary edema in the non obscured lungs. No pneumothorax.  IMPRESSION: 1. Moderate left pleural effusion which appears partly loculated. 2. Chronic cardiomegaly.   Electronically Signed   By: Marnee Spring M.D.   On: 11/06/2014 00:10    EKG: Independently reviewed.  Assessment/Plan Active Problems:   ESRD (end stage renal disease)   Fluid overload   Loculated pleural effusion   DM2 (diabetes mellitus, type 2)   1. Loculated Pleural effusion - L sided loculated pleural effusion, unknown cause from CT scan.  Would appear to be the major cause of his SOB given the lack of obvious pulmonary edema or fluid overload on lungs. 1. Have ordered initial attempt to drain with IR paracentesis and labs to be drawn on the fluid. 2. Patient may end up needing chest tube and even CTS eval. 2. ESRD with fluid overload - called nephrology consult line, patient to dialysis later this morning 3. DM2 - will continue patient on his "sliding scale 70/30" for now, he says he typically takes 28-30 units BID depending on  BGL, but have ordered BGL checks AC/HS, and have ordered diabetic coordinator consult (sliding scale 70/30 is an unusual management strategy).    Code Status: Full Code  Family Communication: Daughter at bedside Disposition Plan: Admit to inpatient   Time spent: 70 min  GARDNER, JARED M. [facility] Pager [phone]  If 7AM-7PM, please contact the day team taking care of the patient [email or web address] Password [email or web address] 11/06/2014, 2:51 AM

## 2014-11-06 NOTE — Progress Notes (Addendum)
Inpatient Diabetes Program Recommendations  AACE/ADA: New Consensus Statement on Inpatient Glycemic Control (2013)  Target Ranges:  Prepandial:   less than 140 mg/dL      Peak postprandial:   less than 180 mg/dL (1-2 hours)      Critically ill patients:  140 - 180 mg/dL     Results for Adam ChamberlainEELE, Daveyon C (MRN [id number]) as of 11/06/2014 08:27  Ref. Range 11/06/2014 00:00  Glucose Latest Ref Range: 70-99 mg/dL 409156 (H)    Admit with: SOB/ Pleural Effusion/ Volume Overload  History: DM, ESRD, COPD, HTN, CHF  Home DM Meds: 70/30 insulin:  28-30 units bidwc  Current DM Orders: 70/30 insulin: 28-30 units bidwc     **Will see patient today to try to clarify home insulin regimen    MD- Please consider the following:  1. Change 70/30 insulin orders to 25 units bidwc (can titrate upward as needed)  2. Start Novolog Moderate SSI tid ac + HS    Addendum 1130: Spoke with patient this AM.  Patient stated he takes anywhere from 20-30 units bid with meals at home for his 70/30 insulin based in what his CBG levels are.  Sees Deforest Hoylesaniel Gentry, MD for insulin management.  Patient could not be more specific about his insulin doses.     Will follow Ambrose FinlandJeannine Johnston Veniamin Kincaid RN, MSN, CDE Diabetes Coordinator Inpatient Diabetes Program Team Pager: [phone] (8a-5p)

## 2014-11-06 NOTE — Progress Notes (Signed)
Called and spoke ED RN to verify if patient will need Resp Isolation Room due to possible shingles on patient's hands per ED MD note.  She will verify with MD and call back.  [place] [employer] MinorKimberly Araya Roel RN-BC, WTA.

## 2014-11-07 ENCOUNTER — Inpatient Hospital Stay (HOSPITAL_COMMUNITY): Payer: Medicare Other

## 2014-11-07 ENCOUNTER — Encounter (HOSPITAL_COMMUNITY): Payer: Self-pay | Admitting: Internal Medicine

## 2014-11-07 DIAGNOSIS — G894 Chronic pain syndrome: Secondary | ICD-10-CM

## 2014-11-07 DIAGNOSIS — J9621 Acute and chronic respiratory failure with hypoxia: Secondary | ICD-10-CM

## 2014-11-07 DIAGNOSIS — J9620 Acute and chronic respiratory failure, unspecified whether with hypoxia or hypercapnia: Secondary | ICD-10-CM

## 2014-11-07 DIAGNOSIS — I10 Essential (primary) hypertension: Secondary | ICD-10-CM

## 2014-11-07 DIAGNOSIS — K219 Gastro-esophageal reflux disease without esophagitis: Secondary | ICD-10-CM

## 2014-11-07 LAB — BASIC METABOLIC PANEL
Anion gap: 15 (ref 5–15)
BUN: 71 mg/dL — ABNORMAL HIGH (ref 6–23)
CALCIUM: 8.8 mg/dL (ref 8.4–10.5)
CO2: 22 mmol/L (ref 19–32)
CREATININE: 7.98 mg/dL — AB (ref 0.50–1.35)
Chloride: 98 mmol/L (ref 96–112)
GFR calc Af Amer: 7 mL/min — ABNORMAL LOW (ref >90)
GFR calc non Af Amer: 6 mL/min — ABNORMAL LOW (ref >90)
Glucose, Bld: 79 mg/dL (ref 70–99)
Potassium: 5.3 mmol/L — ABNORMAL HIGH (ref 3.5–5.1)
Sodium: 135 mmol/L (ref 135–145)

## 2014-11-07 LAB — CBC
HCT: 32.5 % — ABNORMAL LOW (ref 39.0–52.0)
Hemoglobin: 10 g/dL — ABNORMAL LOW (ref 13.0–17.0)
MCH: 27.9 pg (ref 26.0–34.0)
MCHC: 30.8 g/dL (ref 30.0–36.0)
MCV: 90.5 fL (ref 78.0–100.0)
Platelets: UNDETERMINED 10*3/uL (ref 150–400)
RBC: 3.59 MIL/uL — AB (ref 4.22–5.81)
RDW: 15.5 % (ref 11.5–15.5)
WBC: 11.8 10*3/uL — ABNORMAL HIGH (ref 4.0–10.5)

## 2014-11-07 LAB — GLUCOSE, CAPILLARY
GLUCOSE-CAPILLARY: 95 mg/dL (ref 70–99)
GLUCOSE-CAPILLARY: 89 mg/dL (ref 70–99)
Glucose-Capillary: 116 mg/dL — ABNORMAL HIGH (ref 70–99)
Glucose-Capillary: 83 mg/dL (ref 70–99)

## 2014-11-07 LAB — HEPATITIS B SURFACE ANTIGEN: HEP B S AG: NEGATIVE

## 2014-11-07 MED ORDER — DOXERCALCIFEROL 4 MCG/2ML IV SOLN
4.5000 ug | INTRAVENOUS | Status: DC
  Administered 2014-11-08: 4.5 ug via INTRAVENOUS
  Filled 2014-11-07: qty 4

## 2014-11-07 MED ORDER — ALTEPLASE 2 MG IJ SOLR
2.0000 mg | Freq: Once | INTRAMUSCULAR | Status: DC | PRN
  Filled 2014-11-07: qty 2

## 2014-11-07 MED ORDER — NEPRO/CARBSTEADY PO LIQD
237.0000 mL | ORAL | Status: DC | PRN
  Filled 2014-11-07: qty 237

## 2014-11-07 MED ORDER — ALBUTEROL SULFATE (2.5 MG/3ML) 0.083% IN NEBU
2.5000 mg | INHALATION_SOLUTION | Freq: Once | RESPIRATORY_TRACT | Status: AC
  Administered 2014-11-07: 2.5 mg via RESPIRATORY_TRACT

## 2014-11-07 MED ORDER — ALBUTEROL SULFATE (2.5 MG/3ML) 0.083% IN NEBU: 2.5000 mg | INHALATION_SOLUTION | RESPIRATORY_TRACT | Status: DC | PRN

## 2014-11-07 MED ORDER — DARBEPOETIN ALFA 100 MCG/0.5ML IJ SOSY: 100.0000 ug | PREFILLED_SYRINGE | INTRAMUSCULAR | Status: DC

## 2014-11-07 MED ORDER — LIDOCAINE HCL (PF) 1 % IJ SOLN: 5.0000 mL | INTRAMUSCULAR | Status: DC | PRN

## 2014-11-07 MED ORDER — SODIUM CHLORIDE 0.9 % IV SOLN
125.0000 mg | Freq: Once | INTRAVENOUS | Status: DC
  Filled 2014-11-07: qty 10

## 2014-11-07 MED ORDER — DOXERCALCIFEROL 4 MCG/2ML IV SOLN: 4.5000 ug | Freq: Once | INTRAVENOUS | Status: DC

## 2014-11-07 MED ORDER — ALBUTEROL SULFATE (2.5 MG/3ML) 0.083% IN NEBU
INHALATION_SOLUTION | RESPIRATORY_TRACT | Status: AC
  Filled 2014-11-07: qty 3

## 2014-11-07 MED ORDER — PENTAFLUOROPROP-TETRAFLUOROETH EX AERO: 1.0000 "application " | INHALATION_SPRAY | CUTANEOUS | Status: DC | PRN

## 2014-11-07 MED ORDER — HEPARIN SODIUM (PORCINE) 1000 UNIT/ML DIALYSIS: 1000.0000 [IU] | INTRAMUSCULAR | Status: DC | PRN

## 2014-11-07 MED ORDER — SODIUM CHLORIDE 0.9 % IV SOLN: 100.0000 mL | INTRAVENOUS | Status: DC | PRN

## 2014-11-07 MED ORDER — LIDOCAINE-PRILOCAINE 2.5-2.5 % EX CREA
1.0000 "application " | TOPICAL_CREAM | CUTANEOUS | Status: DC | PRN
  Filled 2014-11-07: qty 5

## 2014-11-07 MED ORDER — NA FERRIC GLUC CPLX IN SUCROSE 12.5 MG/ML IV SOLN
125.0000 mg | INTRAVENOUS | Status: DC
  Administered 2014-11-08: 125 mg via INTRAVENOUS
  Filled 2014-11-07 (×2): qty 10

## 2014-11-07 MED ORDER — BUDESONIDE 0.25 MG/2ML IN SUSP
0.2500 mg | Freq: Two times a day (BID) | RESPIRATORY_TRACT | Status: DC
  Administered 2014-11-08 – 2014-11-09 (×3): 0.25 mg via RESPIRATORY_TRACT
  Filled 2014-11-07 (×6): qty 2

## 2014-11-07 MED ORDER — DARBEPOETIN ALFA 100 MCG/0.5ML IJ SOSY
100.0000 ug | PREFILLED_SYRINGE | INTRAMUSCULAR | Status: DC
  Administered 2014-11-08: 100 ug via INTRAVENOUS
  Filled 2014-11-07: qty 0.5

## 2014-11-07 NOTE — Progress Notes (Signed)
[facility] PROGRESS NOTE  Adam Gates EAV:[id number] DOB: 08-15-1940 DOA: 11/05/2014 PCP: Deforest Hoyles, MD  Assessment/Plan: 1-acute on chronic resp failure: with mild hypoxia; appears to be secondary to pleural effusion and mild fluid overload from missing HD -s/p thoracentesis -plan is to try to adjust volume with HD treatment -continue low sodium renal/modified carb diet -continue home oxygen supplementation -follow clinical response  2-ESRD: per renal service -patient on HD M-W-F  3-HTN: stable -will continue coreg  4-Anemia of chronic disease: continue aranesp and iron as per renal discretion -Hgb stable at 10 -will monitor  5-COPD: currently no wheezing -will continue albuterol and pulmicort -continue oxygen supplementation  6-secondary hyperparathyroidism: continue hectorol  7-depression: continue zoloft  8-Diabetes type 2 with neuropathy  : on insulin:  -will continue 70/30 and SSI -A1C pending  9-GERD: continue PPI  10-chronic pain: affecting his neck -will continuee the use of home PRN pain meds   Code Status: Full Family Communication: no family at bedside Disposition Plan: remains inpatient for now; follow resp status after HD   Consultants:  Renal service  IR for thoracentesis  Procedures:  See below for x-ray reports  thoracentesis 4/20: yielded 700cc of bloody fluid; no malignant cells and so far no growth in culture  Antibiotics:  None   HPI/Subjective: Seen during HD, no CP, no fever, no nausea and no vomiting. Patient still complaining of SOB. Also with complaints of pain on his neck (which is not new)  Objective: Filed Vitals:   11/07/14 1619  BP: 129/48  Pulse: 71  Temp: 97.8 F (36.6 C)  Resp: 18    Intake/Output Summary (Last 24 hours) at 11/07/14 2211 Last data filed at 11/07/14 1901  Gross per 24 hour  Intake    720 ml  Output   1872 ml  Net  -1152 ml   Filed Weights   11/06/14 0530 11/06/14 2225  11/07/14 0900  Weight: 118.1 kg (260 lb 5.8 oz) 118.8 kg (261 lb 14.5 oz) 119.4 kg (263 lb 3.7 oz)    Exam:   General:  Afebrile; complaining of neck pain and with still complaints of SOB.  Cardiovascular: no rubs or gallops, mild JVD on exam, S1 and S2 appreciated on exam  Respiratory: decrease breath at the bases, scattered rhonchi and no frank crackles   Abdomen: soft, NT, ND, positive BS  Musculoskeletal: positive 1+ edema bilaterally, patient with significant pain on his legs bilaterally (even to light touch; not new and according to patient due to neuropathy)   Data Reviewed: Basic Metabolic Panel:  Recent Labs Lab 11/06/14 11/07/14 0656  NA 134* 135  K 4.6 5.3*  CL 100 98  CO2 21 22  GLUCOSE 156* 79  BUN 52* 71*  CREATININE 6.83* 7.98*  CALCIUM 8.7 8.8   Liver Function Tests:  Recent Labs Lab 11/06/14  AST 18  ALT 14  ALKPHOS 58  BILITOT 0.5  PROT 6.6  ALBUMIN 3.2*   CBC:  Recent Labs Lab 11/06/14 11/07/14 0656  WBC 9.1 11.8*  NEUTROABS 7.9*  --   HGB 8.6* 10.0*  HCT 27.6* 32.5*  MCV 90.2 90.5  PLT 136* PLATELET CLUMPS NOTED ON SMEAR, UNABLE TO ESTIMATE   BNP (last 3 results)  Recent Labs  11/06/14  BNP 1230.8*   CBG:  Recent Labs Lab 11/06/14 1706 11/06/14 2228 11/07/14 0808 11/07/14 1621 11/07/14 2130  GLUCAP 116* 90 83 95 89    Recent Results (from the past 240 hour(s))  MRSA PCR Screening  Status: None   Collection Time: 11/06/14  4:36 AM  Result Value Ref Range Status   MRSA by PCR NEGATIVE NEGATIVE Final    Comment:        The GeneXpert MRSA Assay (FDA approved for NASAL specimens only), is one component of a comprehensive MRSA colonization surveillance program. It is not intended to diagnose MRSA infection nor to guide or monitor treatment for MRSA infections.   Body fluid culture     Status: None (Preliminary result)   Collection Time: 11/06/14 10:17 AM  Result Value Ref Range Status   Specimen Description  FLUID PLEURAL LEFT  Final   Special Requests NONE  Final   Gram Stain   Final    MODERATE WBC PRESENT,BOTH PMN AND MONONUCLEAR NO ORGANISMS SEEN Performed at [facility]    Culture   Final    NO GROWTH 1 DAY Performed at [facility]    Report Status PENDING  Incomplete     Studies: Dg Chest 1 View  11/07/2014   CLINICAL DATA:  Shortness of breath, history end-stage renal disease on dialysis, COPD, diabetes, hypertension, CHF, hyperlipidemia  EXAM: CHEST  1 VIEW  COMPARISON:  11/05/2014  FINDINGS: Enlargement of cardiac silhouette with pulmonary vascular congestion.  Atherosclerotic calcification aorta.  LEFT pleural effusion and basilar atelectasis, pleural effusion appearing decreased since previous exam.  No pneumothorax.  RIGHT lung clear.  Bones unremarkable.  IMPRESSION: Enlargement of cardiac silhouette with pulmonary vascular congestion.  Decreased LEFT pleural effusion atelectasis.   Electronically Signed   By: Ulyses SouthwardMark  Boles M.D.   On: 11/07/2014 08:14   Dg Chest 1 View  11/06/2014   CLINICAL DATA:  74 year old male status post left chest thoracentesis. Initial encounter.  EXAM: CHEST  1 VIEW  COMPARISON:  Chest CT 0200 hours today and earlier.  FINDINGS: Portable AP semi upright view at 1022 hours. Decreased left pleural effusion and improved left lung ventilation. No pneumothorax identified. Stable cardiac size and mediastinal contours. Stable right lung. Right axillary vascular stent re- identified.  IMPRESSION: Decreased left pleural effusion, improved ventilation, and no pneumothorax identified following left side thoracentesis.   Electronically Signed   By: Odessa FlemingH  Hall M.D.   On: 11/06/2014 10:59   Ct Chest W Contrast  11/06/2014   CLINICAL DATA:  Increase shortness of breath. Dialysis which has been missed over the past few days.  EXAM: CT CHEST WITH CONTRAST  TECHNIQUE: Multidetector CT imaging of the chest was performed during intravenous contrast administration.   CONTRAST:  75mL OMNIPAQUE IOHEXOL 300 MG/ML  SOLN  COMPARISON:  Chest x-ray from 1 day prior  FINDINGS: THORACIC INLET/BODY WALL:  No acute abnormality.  MEDIASTINUM:  Mild cardiomegaly. No pericardial effusion. There is extensive atherosclerosis, including the coronary arteries. Moderate aortic valvular calcifications/sclerosis. No acute vascular findings. No suspicious lymph nodes. Negative esophagus.  LUNG WINDOWS:  There is a moderate left pleural effusion which is loculated along the lateral wall. Septations are faintly visible posteriorly and there is mild, intermittent pleural thickening. There is a bandlike opacity in the lower lingula with volume loss and segmental airway opacification. No definitive pneumonia. There is a trace right pleural effusion which is layering.  UPPER ABDOMEN:  No acute findings.  OSSEOUS:  No acute fracture.  No suspicious lytic or blastic lesions.  IMPRESSION: 1. Moderate, loculated and partially septated left pleural effusion. No definitive cause identified, consider sampling. 2. Inferior lingula atelectasis with segmental airway opacification or obstruction. No mass is seen, but  close CT follow-up is recommend given #1. 3. Moderate aortic valve calcifications/sclerosis.   Electronically Signed   By: Marnee Spring M.D.   On: 11/06/2014 02:24   Dg Chest Port 1 View  11/06/2014   CLINICAL DATA:  Shortness of breath.  EXAM: PORTABLE CHEST - 1 VIEW  COMPARISON:  05/31/2014  FINDINGS: There is a moderate left pleural effusion which does not appear free-flowing.  Chronic cardiomegaly. No pulmonary edema in the non obscured lungs. No pneumothorax.  IMPRESSION: 1. Moderate left pleural effusion which appears partly loculated. 2. Chronic cardiomegaly.   Electronically Signed   By: Marnee Spring M.D.   On: 11/06/2014 00:10   US Thoracentesis Asp Pleural Space W/img Guide  11/06/2014   INDICATION: Symptomatic left sided pleural effusion  EXAM: US THORACENTESIS ASP PLEURAL SPACE  W/IMG GUIDE  COMPARISON:  Chest CT 11/06/14.  MEDICATIONS: None  COMPLICATIONS: None immediate  TECHNIQUE: Informed written consent was obtained from the patient after a discussion of the risks, benefits and alternatives to treatment. A timeout was performed prior to the initiation of the procedure.  Initial ultrasound scanning demonstrates a left pleural effusion. The lower chest was prepped and draped in the usual sterile fashion. 1% lidocaine was used for local anesthesia.  Under direct ultrasound guidance, a 19 gauge, 10-cm, Yueh catheter was introduced. An ultrasound image was saved for documentation purposes. The thoracentesis was performed. The catheter was removed and a dressing was applied. The patient tolerated the procedure well without immediate post procedural complication. The patient was escorted to have an upright chest radiograph.  FINDINGS: A total of approximately 700 ml of dark bloody fluid was removed. Requested samples were sent to the laboratory.  IMPRESSION: Successful ultrasound-guided left sided thoracentesis yielding 700 ml of pleural fluid.  Read By:  Pattricia Boss PA-C   Electronically Signed   By: Richarda Overlie M.D.   On: 11/06/2014 12:08    Scheduled Meds: . albuterol  2.5 mg Nebulization QID  . antiseptic oral rinse  7 mL Mouth Rinse BID  . carvedilol  6.25 mg Oral BID WC  . [START ON 11/08/2014] darbepoetin (ARANESP) injection - DIALYSIS  100 mcg Intravenous Q Fri-HD  . donepezil  10 mg Oral QHS  . [START ON 11/08/2014] doxercalciferol  4.5 mcg Intravenous Q M,W,F-HD  . [START ON 11/08/2014] ferric gluconate (FERRLECIT/NULECIT) IV  125 mg Intravenous Q M,W,F-HD  . heparin  5,000 Units Subcutaneous 3 times per day  . insulin aspart  0-15 Units Subcutaneous TID WC  . insulin aspart protamine- aspart  26 Units Subcutaneous BID WC  . pantoprazole  80 mg Oral Daily  . sertraline  50 mg Oral Daily   Continuous Infusions:   Active Problems:   ESRD (end stage renal disease)    Fluid overload   Loculated pleural effusion   DM2 (diabetes mellitus, type 2)   SOB (shortness of breath)    Time spent: 30 minutes    Vassie Loll  [facility] Pager [phone]. If 7PM-7AM, please contact night-coverage at [email or web address], password [facility] 11/07/2014, 10:11 PM  LOS: 1 day

## 2014-11-07 NOTE — Progress Notes (Signed)
Paged Dr. Gwenlyn PerkingMadera, pt does not have nephrology consult.

## 2014-11-07 NOTE — Progress Notes (Signed)
Pt short of breath, accessory muscles in use.  Gave proventil breathing tx.  Tx effective.

## 2014-11-07 NOTE — Consult Note (Signed)
[facility] Renal Consultation Note  Indication for Consultation:  Management of ESRD/hemodialysis; anemia, hypertension/volume and secondary hyperparathyroidism  HPI: Adam Gates is a 74 y.o. male with a history of Diabetes Type 2, hypertension, COPD on home oxygen and nebulizers, CHF, and ESRD on dialysis, starting 08/27/11 secondary to diabetic nephropathy, at [facility] who was instructed to return for extra treatment on Tuesday 4/19 after leaving Monday's dialysis with 4 L over his dry weight, but was unable to get a ride and presented to the ED on Tuesday night with worsening dyspnea.  His dialysis center reports that fluid removal during his treatments is usually limited by cramping and occasional large gains.  Imaging showed a moderate, loculated and partially separated left pleural effusion, but no significant pulmonary edema.  Thoracentesis per Interventional Radiology yesterday yielded 700 ml of dark bloody fluid, and afterwards the patient reported breathing better.  He denies any nausea, vomiting, fever, or chills, but has chronic pain secondary to severe inoperable cervical stenosis, requiring a cervical collar.  Dialysis Orders:   MWF @ [facility] 4:15      114 kg     2K/2.5Ca       400/800       Heparin 4700-U bolus, 500 U/hr     AVF @ [facility] Hectorol 4.5 mcg        Aranesp 40 mcg on Wed      Ferrlicit 528 mg x 8 (completed 3)  Past Medical History  Diagnosis Date  . COPD (chronic obstructive pulmonary disease)   . Diabetes mellitus without complication   . Renal disorder   . Hypertension   . Hyperlipidemia   . CHF (congestive heart failure)    Past Surgical History  Procedure Laterality Date  . Hand surgery    . Replacement total knee Left   . Coronary stent placement    . Rotator cuff repair Left   . Cataract extraction    . Wrist surgery Right    No family history on file.  Social History He quit smoking cigarettes, two packs a day,  20 years ago and moderate alcohol years ago, but denies any illicit drug use.    Allergies  Allergen Reactions  . Ambien [Zolpidem Tartrate] Other (See Comments)    Mental changes  . Gabapentin Swelling  . Lunesta [Eszopiclone] Other (See Comments)    Mental changes   . Norvasc [Amlodipine Besylate] Swelling   Prior to Admission medications   Medication Sig Start Date End Date Taking? Authorizing Provider  albuterol (PROVENTIL) (2.5 MG/3ML) 0.083% nebulizer solution Take 2.5 mg by nebulization 4 (four) times daily.   Yes Historical Provider, MD  carvedilol (COREG) 6.25 MG tablet Take 6.25 mg by mouth 2 (two) times daily with a meal.   Yes Historical Provider, MD  donepezil (ARICEPT) 10 MG tablet Take 10 mg by mouth at bedtime.   Yes Historical Provider, MD  HYDROcodone-acetaminophen (NORCO) 10-325 MG per tablet Take 1 tablet by mouth every 4 (four) hours as needed for moderate pain.   Yes Historical Provider, MD  insulin NPH-regular Human (NOVOLIN 70/30) (70-30) 100 UNIT/ML injection Inject 0-60 Units into the skin 2 (two) times daily.   Yes Historical Provider, MD  morphine (MSIR) 30 MG tablet Take 30-60 mg by mouth every 4 (four) hours as needed for severe pain.   Yes Historical Provider, MD  omeprazole (PRILOSEC) 40 MG capsule Take 40 mg by mouth every morning.   Yes Historical Provider, MD  sertraline (  ZOLOFT) 50 MG tablet Take 50 mg by mouth daily.   Yes Historical Provider, MD   Labs:  Results for orders placed or performed during the hospital encounter of 11/05/14 (from the past 48 hour(s))  Comprehensive metabolic panel     Status: Abnormal   Collection Time: 11/06/14 12:00 AM  Result Value Ref Range   Sodium 134 (L) 135 - 145 mmol/L   Potassium 4.6 3.5 - 5.1 mmol/L   Chloride 100 96 - 112 mmol/L   CO2 21 19 - 32 mmol/L   Glucose, Bld 156 (H) 70 - 99 mg/dL   BUN 52 (H) 6 - 23 mg/dL   Creatinine, Ser 6.83 (H) 0.50 - 1.35 mg/dL   Calcium 8.7 8.4 - 10.5 mg/dL   Total Protein  6.6 6.0 - 8.3 g/dL   Albumin 3.2 (L) 3.5 - 5.2 g/dL   AST 18 0 - 37 U/L   ALT 14 0 - 53 U/L   Alkaline Phosphatase 58 39 - 117 U/L   Total Bilirubin 0.5 0.3 - 1.2 mg/dL   GFR calc non Af Amer 7 (L) >90 mL/min   GFR calc Af Amer 8 (L) >90 mL/min    Comment: (NOTE) The eGFR has been calculated using the CKD EPI equation. This calculation has not been validated in all clinical situations. eGFR's persistently <90 mL/min signify possible Chronic Kidney Disease.    Anion gap 13 5 - 15  Brain natriuretic peptide (only with dyspnea)     Status: Abnormal   Collection Time: 11/06/14 12:00 AM  Result Value Ref Range   B Natriuretic Peptide 1230.8 (H) 0.0 - 100.0 pg/mL  CBC with Differential     Status: Abnormal   Collection Time: 11/06/14 12:00 AM  Result Value Ref Range   WBC 9.1 4.0 - 10.5 K/uL   RBC 3.06 (L) 4.22 - 5.81 MIL/uL   Hemoglobin 8.6 (L) 13.0 - 17.0 g/dL   HCT 27.6 (L) 39.0 - 52.0 %   MCV 90.2 78.0 - 100.0 fL   MCH 28.1 26.0 - 34.0 pg   MCHC 31.2 30.0 - 36.0 g/dL   RDW 15.4 11.5 - 15.5 %   Platelets 136 (L) 150 - 400 K/uL   Neutrophils Relative % 85 (H) 43 - 77 %   Neutro Abs 7.9 (H) 1.7 - 7.7 K/uL   Lymphocytes Relative 7 (L) 12 - 46 %   Lymphs Abs 0.6 (L) 0.7 - 4.0 K/uL   Monocytes Relative 7 3 - 12 %   Monocytes Absolute 0.6 0.1 - 1.0 K/uL   Eosinophils Relative 1 0 - 5 %   Eosinophils Absolute 0.1 0.0 - 0.7 K/uL   Basophils Relative 0 0 - 1 %   Basophils Absolute 0.0 0.0 - 0.1 K/uL  I-stat troponin, ED (not at [facility])     Status: None   Collection Time: 11/06/14 12:10 AM  Result Value Ref Range   Troponin i, poc 0.05 0.00 - 0.08 ng/mL   Comment 3            Comment: Due to the release kinetics of cTnI, a negative result within the first hours of the onset of symptoms does not rule out myocardial infarction with certainty. If myocardial infarction is still suspected, repeat the test at appropriate intervals.   MRSA PCR Screening     Status: None   Collection  Time: 11/06/14  4:36 AM  Result Value Ref Range   MRSA by PCR NEGATIVE NEGATIVE  Comment:        The GeneXpert MRSA Assay (FDA approved for NASAL specimens only), is one component of a comprehensive MRSA colonization surveillance program. It is not intended to diagnose MRSA infection nor to guide or monitor treatment for MRSA infections.   Glucose, capillary     Status: Abnormal   Collection Time: 11/06/14  8:19 AM  Result Value Ref Range   Glucose-Capillary 119 (H) 70 - 99 mg/dL  pH, body fluid     Status: None   Collection Time: 11/06/14 10:17 AM  Result Value Ref Range   pH, Fluid Type      CORRECTED ON 04/20 AT 1100: PREVIOUSLY REPORTED AS PLEURAL, L    Comment: CORRECTED ON 04/20 AT 1837: PREVIOUSLY REPORTED AS FLUID PLEURAL LEFT, CORRECTED ON 04/20 AT 1100: PREVIOUSLY REPORTED AS Pleural, L   pH, Fluid 8.00     Comment: Performed at [facility]  Lactate dehydrogenase, pleural or peritoneal fluid     Status: Abnormal   Collection Time: 11/06/14 10:17 AM  Result Value Ref Range   LD, Fluid 213 (H) 3 - 23 U/L    Comment: (NOTE) Results should be evaluated in conjunction with serum values    Fluid Type-FLDH FLUID     Comment: PLEURAL LEFT CORRECTED ON 04/20 AT 1101: PREVIOUSLY REPORTED AS Pleural, L   Protein, pleural or peritoneal fluid     Status: None   Collection Time: 11/06/14 10:17 AM  Result Value Ref Range   Total protein, fluid 3.5 g/dL    Comment: (NOTE) No normal range established for this test Results should be evaluated in conjunction with serum values    Fluid Type-FTP FLUID     Comment: PLEURAL LEFT CORRECTED ON 04/20 AT 1102: PREVIOUSLY REPORTED AS Pleural, L   Body fluid cell count with differential     Status: Abnormal   Collection Time: 11/06/14 10:17 AM  Result Value Ref Range   Fluid Type-FCT FLUID     Comment: PLEURAL LEFT CORRECTED ON 04/20 AT 1100: PREVIOUSLY REPORTED AS Pleural, L    Color, Fluid RED (A) YELLOW    Appearance, Fluid TURBID (A) CLEAR   WBC, Fluid 642 0 - 1000 cu mm   Neutrophil Count, Fluid 35 (H) 0 - 25 %   Lymphs, Fluid 58 %   Monocyte-Macrophage-Serous Fluid 7 (L) 50 - 90 %   Eos, Fluid NONE SEEN %  Body fluid culture     Status: None (Preliminary result)   Collection Time: 11/06/14 10:17 AM  Result Value Ref Range   Specimen Description FLUID PLEURAL LEFT    Special Requests NONE    Gram Stain      MODERATE WBC PRESENT,BOTH PMN AND MONONUCLEAR NO ORGANISMS SEEN Performed at [facility]    Culture      NO GROWTH 1 DAY Performed at [facility]    Report Status PENDING   Glucose, pleural or peritoneal fluid     Status: None   Collection Time: 11/06/14 10:17 AM  Result Value Ref Range   Glucose, Fluid 107 mg/dL    Comment: (NOTE) No normal range established for this test Results should be evaluated in conjunction with serum values    Fluid Type-FGLU FLUID     Comment: PLEURAL LEFT CORRECTED ON 04/20 AT 1104: PREVIOUSLY REPORTED AS Pleural, L   Amylase, Pleural Fluid     Status: None   Collection Time: 11/06/14 10:17 AM  Result Value Ref Range  Amylase, Pleural Fluid 19 U/L    Comment: (NOTE) Reference range: Values are considered abnormal if they are greater than or equal to two times a simultaneously analyzed serum value. Performed at [facility]   Glucose, capillary     Status: Abnormal   Collection Time: 11/06/14 11:51 AM  Result Value Ref Range   Glucose-Capillary 120 (H) 70 - 99 mg/dL  Glucose, capillary     Status: Abnormal   Collection Time: 11/06/14  5:06 PM  Result Value Ref Range   Glucose-Capillary 116 (H) 70 - 99 mg/dL   Comment 1 Notify RN   Glucose, capillary     Status: None   Collection Time: 11/06/14 10:28 PM  Result Value Ref Range   Glucose-Capillary 90 70 - 99 mg/dL  CBC     Status: Abnormal   Collection Time: 11/07/14  6:56 AM  Result Value Ref Range   WBC 11.8 (H) 4.0 - 10.5 K/uL   RBC 3.59 (L) 4.22 -  5.81 MIL/uL   Hemoglobin 10.0 (L) 13.0 - 17.0 g/dL   HCT 32.5 (L) 39.0 - 52.0 %   MCV 90.5 78.0 - 100.0 fL   MCH 27.9 26.0 - 34.0 pg   MCHC 30.8 30.0 - 36.0 g/dL   RDW 15.5 11.5 - 15.5 %   Platelets PLATELET CLUMPS NOTED ON SMEAR, UNABLE TO ESTIMATE 150 - 400 K/uL  Basic metabolic panel     Status: Abnormal   Collection Time: 11/07/14  6:56 AM  Result Value Ref Range   Sodium 135 135 - 145 mmol/L   Potassium 5.3 (H) 3.5 - 5.1 mmol/L   Chloride 98 96 - 112 mmol/L   CO2 22 19 - 32 mmol/L   Glucose, Bld 79 70 - 99 mg/dL   BUN 71 (H) 6 - 23 mg/dL   Creatinine, Ser 7.98 (H) 0.50 - 1.35 mg/dL   Calcium 8.8 8.4 - 10.5 mg/dL   GFR calc non Af Amer 6 (L) >90 mL/min   GFR calc Af Amer 7 (L) >90 mL/min    Comment: (NOTE) The eGFR has been calculated using the CKD EPI equation. This calculation has not been validated in all clinical situations. eGFR's persistently <90 mL/min signify possible Chronic Kidney Disease.    Anion gap 15 5 - 15  Glucose, capillary     Status: None   Collection Time: 11/07/14  8:08 AM  Result Value Ref Range   Glucose-Capillary 83 70 - 99 mg/dL   Constitutional: negative for chills, fatigue, fevers and sweats Ears, nose, mouth, throat, and face: negative for earaches, hoarseness, nasal congestion and sore throat Respiratory: positive for dyspnea on exertion; negative for cough, hemoptysis and sputum Cardiovascular: positive for mild dyspnea, negative for chest pain, chest pressure/discomfort, orthopnea and palpitations Gastrointestinal: negative for abdominal pain, change in bowel habits, nausea and vomiting Genitourinary:negative, oliguric Musculoskeletal: positive for chronic neck pain; negative for arthralgias, myalgias, and back pain Neurological: positive for chronic gait problems; negative for dizziness, headaches, paresthesia and speech problems  Physical Exam: Filed Vitals:   11/07/14 1130  BP: 114/49  Pulse: 57  Temp:   Resp:      General  appearance: alert, cooperative and no distress Head: Normocephalic, without obvious abnormality, atraumatic Neck: no adenopathy, no carotid bruit, no JVD and supple, symmetrical, trachea midline Resp: diminished breath sounds, but clear bilaterally Cardio: regular rate and rhythm, S1, S2 normal, no murmur, click, rub or gallop GI: soft, non-tender; bowel sounds normal; no masses,  no  organomegaly Extremities: all five right hand digits amputated, darkened lower extremities below knees, no edema, bilateral abrasions on toes Neurologic: Grossly normal Dialysis Access: AVF @ [facility] with + bruit   Assessment/Plan: 1. Dyspnea - Hx COPD on home O2 & nebs; L pleural effusion per CXR, better s/p thoracentesis (700 ml) per IR 4/20. 2. ESRD - HD on MWF @ [facility], K 5.3.  HD today. 3. HTN/volume - BP 114/49 on Carvedilol 6.25 mg bid; wt 119.4 kg, but frequent cramping during HD, unable to reach EDW; CXR with no significant edema. 4. Anemia - Hgb 10, Aranesp 40 mcg qwk, Fe loading (completed 3 of 8) 5. Metabolic bone disease - Hectorol 4.5 mcg, no binders.  6. Nutrition - carb-mod diet. 7. DM Type 2per primary. 8. Cervical stenosis - inoperable, wears cervical collar. 9. 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 ESRD pt from [facility] with SOB and pleural effusion which was drained yesterday yielding 700 cc. Still some dyspnea today but no distress and unable to get much fluid off due to cramping.  Oxygen sat on RA is 98% today, has chronic resp failure. Will follow.  Kelly Splinter MD pager [phone]    cell [phone] 11/07/2014, 6:55 PM

## 2014-11-07 NOTE — Progress Notes (Signed)
Pt gone to dialysis via bed.   

## 2014-11-08 DIAGNOSIS — E11319 Type 2 diabetes mellitus with unspecified diabetic retinopathy without macular edema: Secondary | ICD-10-CM

## 2014-11-08 DIAGNOSIS — E8770 Fluid overload, unspecified: Secondary | ICD-10-CM

## 2014-11-08 LAB — RENAL FUNCTION PANEL
Albumin: 3 g/dL — ABNORMAL LOW (ref 3.5–5.2)
Anion gap: 12 (ref 5–15)
BUN: 56 mg/dL — AB (ref 6–23)
CO2: 25 mmol/L (ref 19–32)
CREATININE: 6.55 mg/dL — AB (ref 0.50–1.35)
Calcium: 8.6 mg/dL (ref 8.4–10.5)
Chloride: 98 mmol/L (ref 96–112)
GFR calc Af Amer: 9 mL/min — ABNORMAL LOW (ref >90)
GFR calc non Af Amer: 7 mL/min — ABNORMAL LOW (ref >90)
GLUCOSE: 52 mg/dL — AB (ref 70–99)
PHOSPHORUS: 7.7 mg/dL — AB (ref 2.3–4.6)
Potassium: 4.5 mmol/L (ref 3.5–5.1)
SODIUM: 135 mmol/L (ref 135–145)

## 2014-11-08 LAB — GLUCOSE, CAPILLARY
Glucose-Capillary: 108 mg/dL — ABNORMAL HIGH (ref 70–99)
Glucose-Capillary: 107 mg/dL — ABNORMAL HIGH (ref 70–99)
Glucose-Capillary: 163 mg/dL — ABNORMAL HIGH (ref 70–99)

## 2014-11-08 LAB — CBC
HEMATOCRIT: 29.1 % — AB (ref 39.0–52.0)
Hemoglobin: 9.1 g/dL — ABNORMAL LOW (ref 13.0–17.0)
MCH: 28.2 pg (ref 26.0–34.0)
MCHC: 31.3 g/dL (ref 30.0–36.0)
MCV: 90.1 fL (ref 78.0–100.0)
Platelets: 177 10*3/uL (ref 150–400)
RBC: 3.23 MIL/uL — ABNORMAL LOW (ref 4.22–5.81)
RDW: 15.5 % (ref 11.5–15.5)
WBC: 11.2 10*3/uL — AB (ref 4.0–10.5)

## 2014-11-08 LAB — HEMOGLOBIN A1C
Hgb A1c MFr Bld: 6.9 % — ABNORMAL HIGH (ref 4.8–5.6)
Mean Plasma Glucose: 151 mg/dL

## 2014-11-08 MED ORDER — NEPRO/CARBSTEADY PO LIQD
237.0000 mL | ORAL | Status: DC | PRN
  Filled 2014-11-08: qty 237

## 2014-11-08 MED ORDER — PENTAFLUOROPROP-TETRAFLUOROETH EX AERO
1.0000 "application " | INHALATION_SPRAY | CUTANEOUS | Status: DC | PRN
  Administered 2014-11-08: 1 via TOPICAL

## 2014-11-08 MED ORDER — HEPARIN SODIUM (PORCINE) 1000 UNIT/ML DIALYSIS: 1000.0000 [IU] | INTRAMUSCULAR | Status: DC | PRN

## 2014-11-08 MED ORDER — PREDNISONE 20 MG PO TABS
40.0000 mg | ORAL_TABLET | Freq: Every day | ORAL | Status: DC
  Administered 2014-11-09: 40 mg via ORAL
  Filled 2014-11-08 (×3): qty 2

## 2014-11-08 MED ORDER — HEPARIN SODIUM (PORCINE) 1000 UNIT/ML DIALYSIS: 20.0000 [IU]/kg | INTRAMUSCULAR | Status: DC | PRN

## 2014-11-08 MED ORDER — SODIUM CHLORIDE 0.9 % IV SOLN: 100.0000 mL | INTRAVENOUS | Status: DC | PRN

## 2014-11-08 MED ORDER — DARBEPOETIN ALFA 100 MCG/0.5ML IJ SOSY
PREFILLED_SYRINGE | INTRAMUSCULAR | Status: AC
  Administered 2014-11-08: 100 ug via INTRAVENOUS
  Filled 2014-11-08: qty 0.5

## 2014-11-08 MED ORDER — ALTEPLASE 2 MG IJ SOLR
2.0000 mg | Freq: Once | INTRAMUSCULAR | Status: DC | PRN
Start: 2014-11-08 — End: 2014-11-08
  Filled 2014-11-08: qty 2

## 2014-11-08 MED ORDER — PENTAFLUOROPROP-TETRAFLUOROETH EX AERO
INHALATION_SPRAY | CUTANEOUS | Status: AC
  Administered 2014-11-08: 1 via TOPICAL
  Filled 2014-11-08: qty 103.5

## 2014-11-08 MED ORDER — RENA-VITE PO TABS
1.0000 | ORAL_TABLET | Freq: Every day | ORAL | Status: DC
  Administered 2014-11-08: 1 via ORAL
  Filled 2014-11-08: qty 1

## 2014-11-08 MED ORDER — LIDOCAINE-PRILOCAINE 2.5-2.5 % EX CREA: 1.0000 "application " | TOPICAL_CREAM | CUTANEOUS | Status: DC | PRN

## 2014-11-08 MED ORDER — LIDOCAINE HCL (PF) 1 % IJ SOLN: 5.0000 mL | INTRAMUSCULAR | Status: DC | PRN

## 2014-11-08 MED ORDER — DOXERCALCIFEROL 4 MCG/2ML IV SOLN
INTRAVENOUS | Status: AC
  Administered 2014-11-08: 4.5 ug via INTRAVENOUS
  Filled 2014-11-08: qty 4

## 2014-11-08 MED ORDER — DOXYCYCLINE HYCLATE 100 MG PO TABS
100.0000 mg | ORAL_TABLET | Freq: Two times a day (BID) | ORAL | Status: DC
  Administered 2014-11-09 (×2): 100 mg via ORAL
  Filled 2014-11-08 (×3): qty 1

## 2014-11-08 NOTE — Progress Notes (Signed)
Subjective: Interval History: has no complaint, feels better with voloff.  Will do HD tomorrow if needed.  Objective: Vital signs in last 24 hours: Temp:  [97.8 F (36.6 C)-98 F (36.7 C)] 97.8 F (36.6 C) (04/22 0745) Pulse Rate:  [57-75] 75 (04/22 0900) Resp:  [18-20] 19 (04/22 0745) BP: (110-139)/(48-75) 133/66 mmHg (04/22 0900) SpO2:  [99 %-100 %] 99 % (04/22 0745) Weight:  [118.4 kg (261 lb 0.4 oz)] 118.4 kg (261 lb 0.4 oz) (04/22 0745) Weight change: 0.6 kg (1 lb 5.2 oz)  Intake/Output from previous day: 04/21 0701 - 04/22 0700 In: 600 [P.O.:600] Out: 1872  Intake/Output this shift:    General appearance: alert, cooperative, moderately obese and cushingoid Resp: diminished breath sounds bilaterally, rales bibasilar and rhonchi bibasilar Cardio: S1, S2 normal and systolic murmur: holosystolic 2/6, blowing at apex GI: obese, pos bs, liver down 5 cm Extremities: edema 2-3+, AVF RUA  Lab Results:  Recent Labs  11/07/14 0656 11/08/14 0639  WBC 11.8* 11.2*  HGB 10.0* 9.1*  HCT 32.5* 29.1*  PLT PLATELET CLUMPS NOTED ON SMEAR, UNABLE TO ESTIMATE 177   BMET:  Recent Labs  11/07/14 0656 11/08/14 0633  NA 135 135  K 5.3* 4.5  CL 98 98  CO2 22 25  GLUCOSE 79 52*  BUN 71* 56*  CREATININE 7.98* 6.55*  CALCIUM 8.8 8.6   No results for input(s): PTH in the last 72 hours. Iron Studies: No results for input(s): IRON, TIBC, TRANSFERRIN, FERRITIN in the last 72 hours.  Studies/Results: Dg Chest 1 View  11/07/2014   CLINICAL DATA:  Shortness of breath, history end-stage renal disease on dialysis, COPD, diabetes, hypertension, CHF, hyperlipidemia  EXAM: CHEST  1 VIEW  COMPARISON:  11/05/2014  FINDINGS: Enlargement of cardiac silhouette with pulmonary vascular congestion.  Atherosclerotic calcification aorta.  LEFT pleural effusion and basilar atelectasis, pleural effusion appearing decreased since previous exam.  No pneumothorax.  RIGHT lung clear.  Bones unremarkable.   IMPRESSION: Enlargement of cardiac silhouette with pulmonary vascular congestion.  Decreased LEFT pleural effusion atelectasis.   Electronically Signed   By: Ulyses Southward M.D.   On: 11/07/2014 08:14   Dg Chest 1 View  11/06/2014   CLINICAL DATA:  74 year old male status post left chest thoracentesis. Initial encounter.  EXAM: CHEST  1 VIEW  COMPARISON:  Chest CT 0200 hours today and earlier.  FINDINGS: Portable AP semi upright view at 1022 hours. Decreased left pleural effusion and improved left lung ventilation. No pneumothorax identified. Stable cardiac size and mediastinal contours. Stable right lung. Right axillary vascular stent re- identified.  IMPRESSION: Decreased left pleural effusion, improved ventilation, and no pneumothorax identified following left side thoracentesis.   Electronically Signed   By: Odessa Fleming M.D.   On: 11/06/2014 10:59   US Thoracentesis Asp Pleural Space W/img Guide  11/06/2014   INDICATION: Symptomatic left sided pleural effusion  EXAM: US THORACENTESIS ASP PLEURAL SPACE W/IMG GUIDE  COMPARISON:  Chest CT 11/06/14.  MEDICATIONS: None  COMPLICATIONS: None immediate  TECHNIQUE: Informed written consent was obtained from the patient after a discussion of the risks, benefits and alternatives to treatment. A timeout was performed prior to the initiation of the procedure.  Initial ultrasound scanning demonstrates a left pleural effusion. The lower chest was prepped and draped in the usual sterile fashion. 1% lidocaine was used for local anesthesia.  Under direct ultrasound guidance, a 19 gauge, 10-cm, Yueh catheter was introduced. An ultrasound image was saved for documentation purposes. The thoracentesis  was performed. The catheter was removed and a dressing was applied. The patient tolerated the procedure well without immediate post procedural complication. The patient was escorted to have an upright chest radiograph.  FINDINGS: A total of approximately 700 ml of dark bloody fluid was  removed. Requested samples were sent to the laboratory.  IMPRESSION: Successful ultrasound-guided left sided thoracentesis yielding 700 ml of pleural fluid.  Read By:  Pattricia BossKoreen Morgan PA-C   Electronically Signed   By: Richarda OverlieAdam  Henn M.D.   On: 11/06/2014 12:08    I have reviewed the patient's current medications.  Assessment/Plan: 1 ESRD vol xs.  HD for 2nd day , can ^ voloff, bp ok.  2 Anemia on epo, ^ 3 HPTH on vit D 4 Obesity 5 COPD  6 Effusion suspect chronic with xs vol 7 DM controlled P HD, may repeat in am, ^ epo    LOS: 2 days   Shanena Pellegrino L 11/08/2014,9:17 AM

## 2014-11-08 NOTE — Progress Notes (Signed)
[facility] PROGRESS NOTE  Adam ChamberlainHoward C Gates ZOX:[id number] DOB: [birth year] DOA: 11/05/2014 PCP: Adam HoylesGENTRY,DANIEL, MD  Assessment/Plan: 1-acute on chronic resp failure: with mild hypoxia; appears to be secondary to pleural effusion and mild fluid overload from missing HD -s/p thoracentesis -plan is to try to adjust volume with HD treatment -continue low sodium renal/modified carb diet -continue home oxygen supplementation -follow clinical response Patient also with increase wheezing and stilll SOB; will add tx for COPD (See Below)  2-ESRD: per renal service -patient on HD M-W-F  3-HTN: stable -will continue coreg  4-Anemia of chronic disease: continue aranesp and iron as per renal discretion -Hgb stable at 10 -will monitor  5-COPD: currently wheezing and still SOB -will continue albuterol and pulmicort -given increase wheezing and still SOB, will treat with prednisone tapering and doxycycline  6-secondary hyperparathyroidism: continue hectorol  7-depression: continue zoloft  8-Diabetes type 2 with neuropathy  : on insulin:  -will continue 70/30 and SSI -A1C 6.9  9-GERD: continue PPI  10-chronic pain: affecting his neck -will continuee the use of home PRN pain meds   Code Status: Full Family Communication: no family at bedside Disposition Plan: remains inpatient for now; follow resp status after HD   Consultants:  Renal service  IR for thoracentesis  Procedures:  See below for x-ray reports  thoracentesis 4/20: yielded 700cc of bloody fluid; no malignant cells and so far no growth in culture  Antibiotics:  None   HPI/Subjective: Feeling better and breathing easier. Now moving more air and with increased wheezing.  Objective: Filed Vitals:   11/08/14 2055  BP: 140/62  Pulse: 79  Temp: 98.4 F (36.9 C)  Resp: 19    Intake/Output Summary (Last 24 hours) at 11/08/14 2345 Last data filed at 11/08/14 2330  Gross per 24 hour  Intake    480 ml   Output   2633 ml  Net  -2153 ml   Filed Weights   11/08/14 0745 11/08/14 1115 11/08/14 2029  Weight: 118.4 kg (261 lb 0.4 oz) 115.9 kg (255 lb 8.2 oz) 115 kg (253 lb 8.5 oz)    Exam:   General:  Afebrile; complaining of neck pain and with still complaints of SOB; but much improved. With increase air movement and also increase in wheezing.  Cardiovascular: no rubs or gallops, mild JVD on exam, S1 and S2 appreciated on exam  Respiratory: improved air movement, positive exp wheezing and rhonchi   Abdomen: soft, NT, ND, positive BS  Musculoskeletal: positive trace to 1+ edema bilaterally, patient with significant pain on his legs bilaterally (even to light touch; not new and according to patient due to neuropathy)   Data Reviewed: Basic Metabolic Panel:  Recent Labs Lab 11/06/14 11/07/14 0656 11/08/14 0633  NA 134* 135 135  K 4.6 5.3* 4.5  CL 100 98 98  CO2 21 22 25   GLUCOSE 156* 79 52*  BUN 52* 71* 56*  CREATININE 6.83* 7.98* 6.55*  CALCIUM 8.7 8.8 8.6  PHOS  --   --  7.7*   Liver Function Tests:  Recent Labs Lab 11/06/14 11/08/14 0633  AST 18  --   ALT 14  --   ALKPHOS 58  --   BILITOT 0.5  --   PROT 6.6  --   ALBUMIN 3.2* 3.0*   CBC:  Recent Labs Lab 11/06/14 11/07/14 0656 11/08/14 0639  WBC 9.1 11.8* 11.2*  NEUTROABS 7.9*  --   --   HGB 8.6* 10.0* 9.1*  HCT 27.6* 32.5* 29.1*  MCV 90.2 90.5 90.1  PLT 136* PLATELET CLUMPS NOTED ON SMEAR, UNABLE TO ESTIMATE 177   BNP (last 3 results)  Recent Labs  11/06/14  BNP 1230.8*   CBG:  Recent Labs Lab 11/07/14 1621 11/07/14 2130 11/08/14 1220 11/08/14 1653 11/08/14 2052  GLUCAP 95 89 108* 107* 163*    Recent Results (from the past 240 hour(s))  MRSA PCR Screening     Status: None   Collection Time: 11/06/14  4:36 AM  Result Value Ref Range Status   MRSA by PCR NEGATIVE NEGATIVE Final    Comment:        The GeneXpert MRSA Assay (FDA approved for NASAL specimens only), is one component of  a comprehensive MRSA colonization surveillance program. It is not intended to diagnose MRSA infection nor to guide or monitor treatment for MRSA infections.   Body fluid culture     Status: None (Preliminary result)   Collection Time: 11/06/14 10:17 AM  Result Value Ref Range Status   Specimen Description FLUID PLEURAL LEFT  Final   Special Requests NONE  Final   Gram Stain   Final    MODERATE WBC PRESENT,BOTH PMN AND MONONUCLEAR NO ORGANISMS SEEN Performed at [facility]    Culture   Final    NO GROWTH 2 DAYS Performed at [facility]    Report Status PENDING  Incomplete     Studies: Dg Chest 1 View  11/07/2014   CLINICAL DATA:  Shortness of breath, history end-stage renal disease on dialysis, COPD, diabetes, hypertension, CHF, hyperlipidemia  EXAM: CHEST  1 VIEW  COMPARISON:  11/05/2014  FINDINGS: Enlargement of cardiac silhouette with pulmonary vascular congestion.  Atherosclerotic calcification aorta.  LEFT pleural effusion and basilar atelectasis, pleural effusion appearing decreased since previous exam.  No pneumothorax.  RIGHT lung clear.  Bones unremarkable.  IMPRESSION: Enlargement of cardiac silhouette with pulmonary vascular congestion.  Decreased LEFT pleural effusion atelectasis.   Electronically Signed   By: Ulyses Southward M.D.   On: 11/07/2014 08:14    Scheduled Meds: . albuterol  2.5 mg Nebulization QID  . antiseptic oral rinse  7 mL Mouth Rinse BID  . budesonide (PULMICORT) nebulizer solution  0.25 mg Nebulization BID  . darbepoetin (ARANESP) injection - DIALYSIS  100 mcg Intravenous Q Fri-HD  . donepezil  10 mg Oral QHS  . doxercalciferol  4.5 mcg Intravenous Q M,W,F-HD  . doxycycline  100 mg Oral Q12H  . ferric gluconate (FERRLECIT/NULECIT) IV  125 mg Intravenous Q M,W,F-HD  . heparin  5,000 Units Subcutaneous 3 times per day  . insulin aspart  0-15 Units Subcutaneous TID WC  . insulin aspart protamine- aspart  26 Units Subcutaneous BID WC  .  multivitamin  1 tablet Oral QHS  . pantoprazole  80 mg Oral Daily  . [START ON 11/09/2014] predniSONE  40 mg Oral Q breakfast  . sertraline  50 mg Oral Daily   Continuous Infusions:   Principal Problem:   Acute on chronic respiratory failure Active Problems:   ESRD (end stage renal disease)   Fluid overload   Loculated pleural effusion   DM2 (diabetes mellitus, type 2)   SOB (shortness of breath)    Time spent: 30 minutes    Vassie Loll  [facility] Pager [phone]. If 7PM-7AM, please contact night-coverage at [email or web address], password [facility] 11/08/2014, 11:45 PM  LOS: 2 days

## 2014-11-08 NOTE — Procedures (Signed)
I was present at this session.  I have reviewed the session itself and made appropriate changes. tol vol off. Needles both up too close so recirc and ineffic HD.   Teffany Blaszczyk L 4/22/20169:20 AM

## 2014-11-09 DIAGNOSIS — E1169 Type 2 diabetes mellitus with other specified complication: Secondary | ICD-10-CM

## 2014-11-09 DIAGNOSIS — Z9889 Other specified postprocedural states: Secondary | ICD-10-CM

## 2014-11-09 LAB — BASIC METABOLIC PANEL
Anion gap: 12 (ref 5–15)
BUN: 41 mg/dL — ABNORMAL HIGH (ref 6–23)
CO2: 24 mmol/L (ref 19–32)
Calcium: 8.6 mg/dL (ref 8.4–10.5)
Chloride: 97 mmol/L (ref 96–112)
Creatinine, Ser: 5.68 mg/dL — ABNORMAL HIGH (ref 0.50–1.35)
GFR calc Af Amer: 10 mL/min — ABNORMAL LOW (ref >90)
GFR calc non Af Amer: 9 mL/min — ABNORMAL LOW (ref >90)
Glucose, Bld: 127 mg/dL — ABNORMAL HIGH (ref 70–99)
Potassium: 4.7 mmol/L (ref 3.5–5.1)
Sodium: 133 mmol/L — ABNORMAL LOW (ref 135–145)

## 2014-11-09 LAB — CBC
HCT: 28.2 % — ABNORMAL LOW (ref 39.0–52.0)
Hemoglobin: 8.7 g/dL — ABNORMAL LOW (ref 13.0–17.0)
MCH: 28.1 pg (ref 26.0–34.0)
MCHC: 30.9 g/dL (ref 30.0–36.0)
MCV: 91 fL (ref 78.0–100.0)
Platelets: 133 10*3/uL — ABNORMAL LOW (ref 150–400)
RBC: 3.1 MIL/uL — ABNORMAL LOW (ref 4.22–5.81)
RDW: 15.7 % — ABNORMAL HIGH (ref 11.5–15.5)
WBC: 8.4 10*3/uL (ref 4.0–10.5)

## 2014-11-09 LAB — BODY FLUID CULTURE: Culture: NO GROWTH

## 2014-11-09 LAB — GLUCOSE, CAPILLARY
GLUCOSE-CAPILLARY: 104 mg/dL — AB (ref 70–99)
Glucose-Capillary: 166 mg/dL — ABNORMAL HIGH (ref 70–99)

## 2014-11-09 MED ORDER — HEPARIN SODIUM (PORCINE) 1000 UNIT/ML DIALYSIS: 1000.0000 [IU] | INTRAMUSCULAR | Status: DC | PRN

## 2014-11-09 MED ORDER — SODIUM CHLORIDE 0.9 % IV SOLN: 100.0000 mL | INTRAVENOUS | Status: DC | PRN

## 2014-11-09 MED ORDER — INSULIN NPH ISOPHANE & REGULAR (70-30) 100 UNIT/ML ~~LOC~~ SUSP: 26.0000 [IU] | Freq: Two times a day (BID) | SUBCUTANEOUS | Status: AC

## 2014-11-09 MED ORDER — HEPARIN SODIUM (PORCINE) 1000 UNIT/ML DIALYSIS
6000.0000 [IU] | Freq: Once | INTRAMUSCULAR | Status: DC
  Filled 2014-11-09: qty 6

## 2014-11-09 MED ORDER — DOCUSATE SODIUM 100 MG PO CAPS
100.0000 mg | ORAL_CAPSULE | Freq: Two times a day (BID) | ORAL | Status: DC
  Administered 2014-11-09: 100 mg via ORAL
  Filled 2014-11-09: qty 1

## 2014-11-09 MED ORDER — PENTAFLUOROPROP-TETRAFLUOROETH EX AERO: 1.0000 "application " | INHALATION_SPRAY | CUTANEOUS | Status: DC | PRN

## 2014-11-09 MED ORDER — LIDOCAINE HCL (PF) 1 % IJ SOLN: 5.0000 mL | INTRAMUSCULAR | Status: DC | PRN

## 2014-11-09 MED ORDER — LIDOCAINE-PRILOCAINE 2.5-2.5 % EX CREA: 1.0000 "application " | TOPICAL_CREAM | CUTANEOUS | Status: DC | PRN

## 2014-11-09 MED ORDER — DOXYCYCLINE HYCLATE 100 MG PO TABS: 100.0000 mg | ORAL_TABLET | Freq: Two times a day (BID) | ORAL | Status: AC

## 2014-11-09 MED ORDER — NEPRO/CARBSTEADY PO LIQD
237.0000 mL | ORAL | Status: DC | PRN
  Filled 2014-11-09: qty 237

## 2014-11-09 MED ORDER — PREDNISONE 20 MG PO TABS: ORAL_TABLET | ORAL | Status: AC

## 2014-11-09 MED ORDER — OMEPRAZOLE 40 MG PO CPDR: 40.0000 mg | DELAYED_RELEASE_CAPSULE | Freq: Two times a day (BID) | ORAL | Status: AC

## 2014-11-09 MED ORDER — ALTEPLASE 2 MG IJ SOLR: 2.0000 mg | Freq: Once | INTRAMUSCULAR | Status: DC | PRN

## 2014-11-09 MED ORDER — BUDESONIDE 0.25 MG/2ML IN SUSP: 0.2500 mg | Freq: Two times a day (BID) | RESPIRATORY_TRACT | Status: AC

## 2014-11-09 NOTE — Progress Notes (Signed)
[facility] Progress Note  Assessment/Plan: 1. Dyspnea/COPD/left pleural effusion s/p 700 ml thorac 4/20- meds per primary 2. ESRD - MWF - UF 1.9 Thursday 2.5 Friday with post weight 115.9 - still above EDW- extra tmt today 3. Anemia - Hgb 9.1 - Aranesp 40/week PTA was given 100 4/22, repleting Fe 4. Secondary hyperparathyroidism - Hecto 4.5 Ca 8.6 P 7.7 I talked with his daughter Toniann Fail - he had been on renvela at one point but stopped due to $$$ - will defer management of this ot his primary nephrologist after d/c 5. HTN/volume - CXR 4/21 showed right lung clear, left pleural effusion, ^^dependent edema - extra tmt today 6. Nutrition - renal diet with fluid restriction  Sheffield Slider, PA-C [facility] Beeper [phone] 11/09/2014,10:04 AM  LOS: 3 days   Pt seen, examined and agree w A/P as above. Patient will be down to dry wt after HD today, ok for dc, have d/w primary MD.  Vinson Moselle MD pager [phone]    cell [phone] 11/09/2014, 1:22 PM    Subjective:   When am I going home? Objective Filed Vitals:   11/08/14 2055 11/09/14 0506 11/09/14 0838 11/09/14 0930  BP: 140/62 141/54  148/66  Pulse: 79 65  80  Temp: 98.4 F (36.9 C) 98.3 F (36.8 C)  97.7 F (36.5 C)  TempSrc:    Oral  Resp: Height:      Weight:      SpO2: 100% 100% 99% 97%   Physical Exam General: obese sitting in bed with neck collar Heart: RRR Lungs: dim BS - exam limited by body habitus - coarse BS  Abdomen:obese; dependent edema in thighs/buttocks Extremities: 1+ LE edema; chronic skin discoloration/changes tender to touch (chronic) Dialysis Access: right upper AVF  Dialysis Orders: MWF @ [facility] 4:15 114 kg 2K/2.5Ca 400/800 Heparin 4700-U bolus, 500 U/hr AVF @ RUA Hectorol 4.5 mcg Aranesp 40 mcg on Wed Ferrlicit 125 mg x 8 (completed 3) Additional Objective Labs: Basic Metabolic Panel:  Recent  Labs Lab 11/06/14 11/07/14 0656 11/08/14 0633  NA 134* 135 135  K 4.6 5.3* 4.5  CL 100 98 98  CO2 GLUCOSE 156* 79 52*  BUN 52* 71* 56*  CREATININE 6.83* 7.98* 6.55*  CALCIUM 8.7 8.8 8.6  PHOS  --   --  7.7*   Liver Function Tests:  Recent Labs Lab 11/06/14 11/08/14 0633  AST 18  --   ALT 14  --   ALKPHOS 58  --   BILITOT 0.5  --   PROT 6.6  --   ALBUMIN 3.2* 3.0*  CBC:  Recent Labs Lab 11/06/14 11/07/14 0656 11/08/14 0639  WBC 9.1 11.8* 11.2*  NEUTROABS 7.9*  --   --   HGB 8.6* 10.0* 9.1*  HCT 27.6* 32.5* 29.1*  MCV 90.2 90.5 90.1  PLT 136* PLATELET CLUMPS NOTED ON SMEAR, UNABLE TO ESTIMATE 177   Blood Culture    Component Value Date/Time   SDES FLUID PLEURAL LEFT 11/06/2014 1017   SPECREQUEST NONE 11/06/2014 1017   CULT  11/06/2014 1017    NO GROWTH 2 DAYS Performed at [facility]    REPTSTATUS PENDING 11/06/2014 1017    CBG:  Recent Labs Lab 11/07/14 2130 11/08/14 1220 11/08/14 1653 11/08/14 2052 11/09/14 0748  GLUCAP 89 108* 107* 163* 104*  Medications:   . albuterol  2.5 mg Nebulization QID  . antiseptic oral rinse  7  mL Mouth Rinse BID  . budesonide (PULMICORT) nebulizer solution  0.25 mg Nebulization BID  . darbepoetin (ARANESP) injection - DIALYSIS  100 mcg Intravenous Q Fri-HD  . docusate sodium  100 mg Oral BID  . donepezil  10 mg Oral QHS  . doxercalciferol  4.5 mcg Intravenous Q M,W,F-HD  . doxycycline  100 mg Oral Q12H  . ferric gluconate (FERRLECIT/NULECIT) IV  125 mg Intravenous Q M,W,F-HD  . heparin  5,000 Units Subcutaneous 3 times per day  . insulin aspart  0-15 Units Subcutaneous TID WC  . insulin aspart protamine- aspart  26 Units Subcutaneous BID WC  . multivitamin  1 tablet Oral QHS  . pantoprazole  80 mg Oral Daily  . predniSONE  40 mg Oral Q breakfast  . sertraline  50 mg Oral Daily

## 2014-11-09 NOTE — Progress Notes (Signed)
11/09/2014 3:02 AM  Patient said he hasn't had a bowel movement since admission. Gave patient 120 mls of prune juice. Will continue to monitor patient.  Veatrice KellsMahmoud,Kirston Luty I, RN  [address] [facility] Phone [phone]

## 2014-11-09 NOTE — Progress Notes (Signed)
Discharge instructions and medications discussed with patient and wife.  Prescriptions give to wife.  All questions answered.

## 2014-11-09 NOTE — Discharge Summary (Signed)
Physician Discharge Summary  Wanita ChamberlainHoward C Hauger ZOX:[id number] DOB: [birth year] DOA: 11/05/2014  PCP: Deforest HoylesGENTRY,DANIEL, MD  Admit date: 11/05/2014 Discharge date: 11/09/2014  Time spent: >30 minutes  Recommendations for Outpatient Follow-up:  1. Reassess patient a medication and adjust as needed to provide better control over his chronic pain 2. Patient will need adjustment of his EDW during hemodialysis for better control of his bowling 3. Repeat CBC to follow hemoglobin trend 4. Please arrange pulmonary function tests and PCA with pulmonology service for further decision on long-term therapy for his COPD.  Discharge Diagnoses:  Principal Problem:   Acute on chronic respiratory failure Active Problems:   ESRD (end stage renal disease)   Fluid overload   Loculated pleural effusion   DM2 (diabetes mellitus, type 2)   SOB (shortness of breath)   Discharge Condition: Stable and improved. Patient has been discharged home with instruction to arrange hospital follow-up with PCP in 10 days.  Diet recommendation: Low sodium and low carbohydrates diet  Filed Weights   11/08/14 2029 11/09/14 1130 11/09/14 1441  Weight: 115 kg (253 lb 8.5 oz) 117 kg (257 lb 15 oz) 114.1 kg (251 lb 8.7 oz)    History of present illness:  74 y.o. male with h/o ESRD, dialysis MWF. Patient presents to ED with c/o increased SOB from his baseline SOB over the past 2 days. 2 days ago at dialysis he was unable to complete a full session due to cramping and so he "left 8 pounds up". He was supposed to return yesterday to dialysis he states to finish the treatment but didn't have transportation and so presents to the ED for what he believes is SOB due to fluid overload from dialysis.  Hospital Course:  1-acute on chronic resp failure: with mild hypoxia; appears to be secondary to pleural effusion and mild fluid overload from missing HD. Patient also with concomitant symptoms from a slight exacerbation on his COPD -s/p  thoracentesis -Significant volume adjustment with HD treatment -Patient might require a decrease in his EDW as an outpatient for future hemodialysis therapy -continue low sodium renal/modified carb diet -continue home oxygen supplementation -Given component of slight COPD exacerbation; patient has been now discharge with tapering steroids treatment and the use of doxycycline  2-ESRD: per renal service -[facility]  3-HTN: stable -will continue coreg twice a day  4-Anemia of chronic disease: continue aranesp and iron as per renal discretion -Hgb stable at 10 -Repeat CBC in outpatient setting to follow hemoglobin trend  5-COPD: currently wheezing and still SOB -will continue albuterol and discharged on pulmicort -given increase wheezing and still SOB, will provide treatment with prednisone tapering and doxycycline -Patient will benefit of outpatient PFTs and further adjustment to long-term therapy for his COPD  6-secondary hyperparathyroidism: continue hectorol as provided by the renal service at hemodialysis treatment.  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 Arlean HoppingSchertz for inpatient hemodialysis treatments)  IR  Discharge Exam: Filed Vitals:   11/09/14 1441  BP: 156/91  Pulse: 91  Temp: 97.8 F (36.6 C)  Resp: 18    General: Afebrile; complaining of neck pain and with still complaints of SOB; but significantly much improved. With increase air movement and  decrease in his expiratory wheezing.  Cardiovascular: no rubs or gallops, mild JVD on exam, S1 and S2 appreciated on exam  Respiratory: improved air movement, positive exp wheezing and rhonchi   Abdomen: soft, NT, ND, positive BS  Musculoskeletal: positive trace to 1+ edema bilaterally, patient with significant pain on his legs bilaterally (even to light touch; not new and according to patient due to neuropathy)   Discharge Instructions   Discharge Instructions    Diet - low sodium heart healthy    Complete by:  As directed      Discharge instructions    Complete by:  As directed   Take medications as prescribed Please be compliant with treatment and hemodialysis sessions Avoid second-hand smoking Follow a low-sodium diet Arrange hospital follow-up with primary care physician in 10 days          Current Discharge Medication List    START taking these medications   Details  budesonide (PULMICORT) 0.25 MG/2ML nebulizer solution Take 2 mLs (0.25 mg total) by nebulization 2 (two) times daily. Qty: 60 mL, Refills: 12    doxycycline (VIBRA-TABS) 100 MG tablet Take 1 tablet (100 mg total) by mouth every 12 (twelve) hours. Qty: 14 tablet, Refills: 0    predniSONE (DELTASONE) 20 MG tablet Take 2 tablets by mouth daily 2 days; then 1 tablet by mouth daily 3 days; then half tablet by mouth daily 3 days and to stop prednisone. Qty: 12 tablet, Refills: 0      CONTINUE these medications which have CHANGED   Details  insulin NPH-regular Human (NOVOLIN 70/30) (70-30) 100 UNIT/ML injection Inject 26 Units into the skin 2 (two) times daily with a meal. Qty: 10 mL, Refills: 11    omeprazole (PRILOSEC) 40 MG capsule Take 1 capsule (40 mg total) by mouth 2 (two) times daily. Qty: 60 capsule, Refills: 1      CONTINUE these medications which have NOT CHANGED   Details  albuterol (PROVENTIL) (2.5 MG/3ML) 0.083% nebulizer solution Take 2.5 mg by nebulization 4 (four) times daily.     carvedilol (COREG) 6.25 MG tablet Take 6.25 mg by mouth 2 (two) times daily with a meal.    donepezil (ARICEPT) 10 MG tablet Take 10 mg by mouth at bedtime.    HYDROcodone-acetaminophen (NORCO) 10-325 MG per tablet Take 1 tablet by mouth every 4 (four) hours as needed for moderate pain.    morphine (MSIR) 30 MG tablet Take 30-60 mg by mouth every 4 (four) hours as needed for severe pain.    sertraline (ZOLOFT) 50 MG tablet Take 50 mg by mouth daily.       Allergies  Allergen Reactions  . Ambien [Zolpidem Tartrate] Other (See Comments)    Mental changes  . Gabapentin Swelling  . Lunesta [Eszopiclone] Other (See Comments)    Mental changes   . Norvasc [Amlodipine Besylate] Swelling   Follow-up Information    Follow up with GENTRY,DANIEL, MD. Schedule an appointment as soon as possible for a visit in 10 days.   Specialty:  Family Medicine   Contact information:   [address] [place] [address]        The results of significant diagnostics from this hospitalization (including imaging, microbiology, ancillary and laboratory) are listed below for reference.    Significant  Diagnostic Studies: Dg Chest 1 View  11/07/2014   CLINICAL DATA:  Shortness of breath, history end-stage renal disease on dialysis, COPD, diabetes, hypertension, CHF, hyperlipidemia  EXAM: CHEST  1 VIEW  COMPARISON:  11/05/2014  FINDINGS: Enlargement of cardiac silhouette with pulmonary vascular congestion.  Atherosclerotic calcification aorta.  LEFT pleural effusion and basilar atelectasis, pleural effusion appearing decreased since previous exam.  No pneumothorax.  RIGHT lung clear.  Bones unremarkable.  IMPRESSION: Enlargement of cardiac silhouette with pulmonary vascular congestion.  Decreased LEFT pleural effusion atelectasis.   Electronically Signed   By: Ulyses Southward M.D.   On: 11/07/2014 08:14   Dg Chest 1 View  11/06/2014   CLINICAL DATA:  74 year old male status post left chest  thoracentesis. Initial encounter.  EXAM: CHEST  1 VIEW  COMPARISON:  Chest CT 0200 hours today and earlier.  FINDINGS: Portable AP semi upright view at 1022 hours. Decreased left pleural effusion and improved left lung ventilation. No pneumothorax identified. Stable cardiac size and mediastinal contours. Stable right lung. Right axillary vascular stent re- identified.  IMPRESSION: Decreased left pleural effusion, improved ventilation, and no pneumothorax identified following left side thoracentesis.   Electronically Signed   By: Odessa Fleming M.D.   On: 11/06/2014 10:59   Ct Chest W Contrast  11/06/2014   CLINICAL DATA:  Increase shortness of breath. Dialysis which has been missed over the past few days.  EXAM: CT CHEST WITH CONTRAST  TECHNIQUE: Multidetector CT imaging of the chest was performed during intravenous contrast administration.  CONTRAST:  75mL OMNIPAQUE IOHEXOL 300 MG/ML  SOLN  COMPARISON:  Chest x-ray from 1 day prior  FINDINGS: THORACIC INLET/BODY WALL:  No acute abnormality.  MEDIASTINUM:  Mild cardiomegaly. No pericardial effusion. There is extensive atherosclerosis, including the coronary arteries. Moderate aortic valvular calcifications/sclerosis. No acute vascular findings. No suspicious lymph nodes. Negative esophagus.  LUNG WINDOWS:  There is a moderate left pleural effusion which is loculated along the lateral wall. Septations are faintly visible posteriorly and there is mild, intermittent pleural thickening. There is a bandlike opacity in the lower lingula with volume loss and segmental airway opacification. No definitive pneumonia. There is a trace right pleural effusion which is layering.  UPPER ABDOMEN:  No acute findings.  OSSEOUS:  No acute fracture.  No suspicious lytic or blastic lesions.  IMPRESSION: 1. Moderate, loculated and partially septated left pleural effusion. No definitive cause identified, consider sampling. 2. Inferior lingula atelectasis with segmental airway opacification or  obstruction. No mass is seen, but close CT follow-up is recommend given #1. 3. Moderate aortic valve calcifications/sclerosis.   Electronically Signed   By: Marnee Spring M.D.   On: 11/06/2014 02:24   Dg Chest Port 1 View  11/06/2014   CLINICAL DATA:  Shortness of breath.  EXAM: PORTABLE CHEST - 1 VIEW  COMPARISON:  05/31/2014  FINDINGS: There is a moderate left pleural effusion which does not appear free-flowing.  Chronic cardiomegaly. No pulmonary edema in the non obscured lungs. No pneumothorax.  IMPRESSION: 1. Moderate left pleural effusion which appears partly loculated. 2. Chronic cardiomegaly.   Electronically Signed   By: Marnee Spring M.D.   On: 11/06/2014 00:10   US Thoracentesis Asp Pleural Space W/img Guide  11/06/2014   INDICATION: Symptomatic left sided pleural effusion  EXAM: US THORACENTESIS ASP PLEURAL SPACE W/IMG GUIDE  COMPARISON:  Chest CT 11/06/14.  MEDICATIONS: None  COMPLICATIONS: None immediate  TECHNIQUE: Informed written consent was obtained from the patient after a discussion of  the risks, benefits and alternatives to treatment. A timeout was performed prior to the initiation of the procedure.  Initial ultrasound scanning demonstrates a left pleural effusion. The lower chest was prepped and draped in the usual sterile fashion. 1% lidocaine was used for local anesthesia.  Under direct ultrasound guidance, a 19 gauge, 10-cm, Yueh catheter was introduced. An ultrasound image was saved for documentation purposes. The thoracentesis was performed. The catheter was removed and a dressing was applied. The patient tolerated the procedure well without immediate post procedural complication. The patient was escorted to have an upright chest radiograph.  FINDINGS: A total of approximately 700 ml of dark bloody fluid was removed. Requested samples were sent to the laboratory.  IMPRESSION: Successful ultrasound-guided left sided thoracentesis yielding 700 ml of pleural fluid.  Read By:  Pattricia Boss PA-C   Electronically Signed   By: Richarda Overlie M.D.   On: 11/06/2014 12:08    Microbiology: Recent Results (from the past 240 hour(s))  MRSA PCR Screening     Status: None   Collection Time: 11/06/14  4:36 AM  Result Value Ref Range Status   MRSA by PCR NEGATIVE NEGATIVE Final    Comment:        The GeneXpert MRSA Assay (FDA approved for NASAL specimens only), is one component of a comprehensive MRSA colonization surveillance program. It is not intended to diagnose MRSA infection nor to guide or monitor treatment for MRSA infections.   Body fluid culture     Status: None   Collection Time: 11/06/14 10:17 AM  Result Value Ref Range Status   Specimen Description FLUID PLEURAL LEFT  Final   Special Requests NONE  Final   Gram Stain   Final    MODERATE WBC PRESENT,BOTH PMN AND MONONUCLEAR NO ORGANISMS SEEN Performed at [facility]    Culture   Final    NO GROWTH 3 DAYS Performed at [facility]    Report Status 11/09/2014 FINAL  Final     Labs: Basic Metabolic Panel:  Recent Labs Lab 11/06/14 11/07/14 0656 11/08/14 0633 11/09/14 1200  NA 134* 135 135 133*  K 4.6 5.3* 4.5 4.7  CL 100 98 98 97  CO2 GLUCOSE 156* 79 52* 127*  BUN 52* 71* 56* 41*  CREATININE 6.83* 7.98* 6.55* 5.68*  CALCIUM 8.7 8.8 8.6 8.6  PHOS  --   --  7.7*  --    Liver Function Tests:  Recent Labs Lab 11/06/14 11/08/14 0633  AST 18  --   ALT 14  --   ALKPHOS 58  --   BILITOT 0.5  --   PROT 6.6  --   ALBUMIN 3.2* 3.0*   CBC:  Recent Labs Lab 11/06/14 11/07/14 0656 11/08/14 0639 11/09/14 1200  WBC 9.1 11.8* 11.2* 8.4  NEUTROABS 7.9*  --   --   --   HGB 8.6* 10.0* 9.1* 8.7*  HCT 27.6* 32.5* 29.1* 28.2*  MCV 90.2 90.5 90.1 91.0  PLT 136* PLATELET CLUMPS NOTED ON SMEAR, UNABLE TO ESTIMATE 177 133*   BNP (last 3 results)  Recent Labs  11/06/14  BNP 1230.8*   CBG:  Recent Labs Lab 11/07/14 2130 11/08/14 1220 11/08/14 1653  11/08/14 2052 11/09/14 0748  GLUCAP 89 108* 107* 163* 104*    Signed:  Vassie Loll  [facility] 11/09/2014, 4:38 PM

## 2014-11-11 DIAGNOSIS — Z9889 Other specified postprocedural states: Secondary | ICD-10-CM | POA: Insufficient documentation

## 2014-11-11 LAB — OTHER BODY FLUID CHEMISTRY

## 2014-11-18 LAB — OTHER BODY FLUID CHEMISTRY

## 2015-08-20 DEATH — deceased

## 2016-05-05 IMAGING — CR DG CHEST 1V PORT
1 series · 1 of 1 positions shown · non-contrast
Comparison: 05/31/2014

CLINICAL DATA: Shortness of breath.

EXAM:
PORTABLE CHEST - 1 VIEW

[AP]
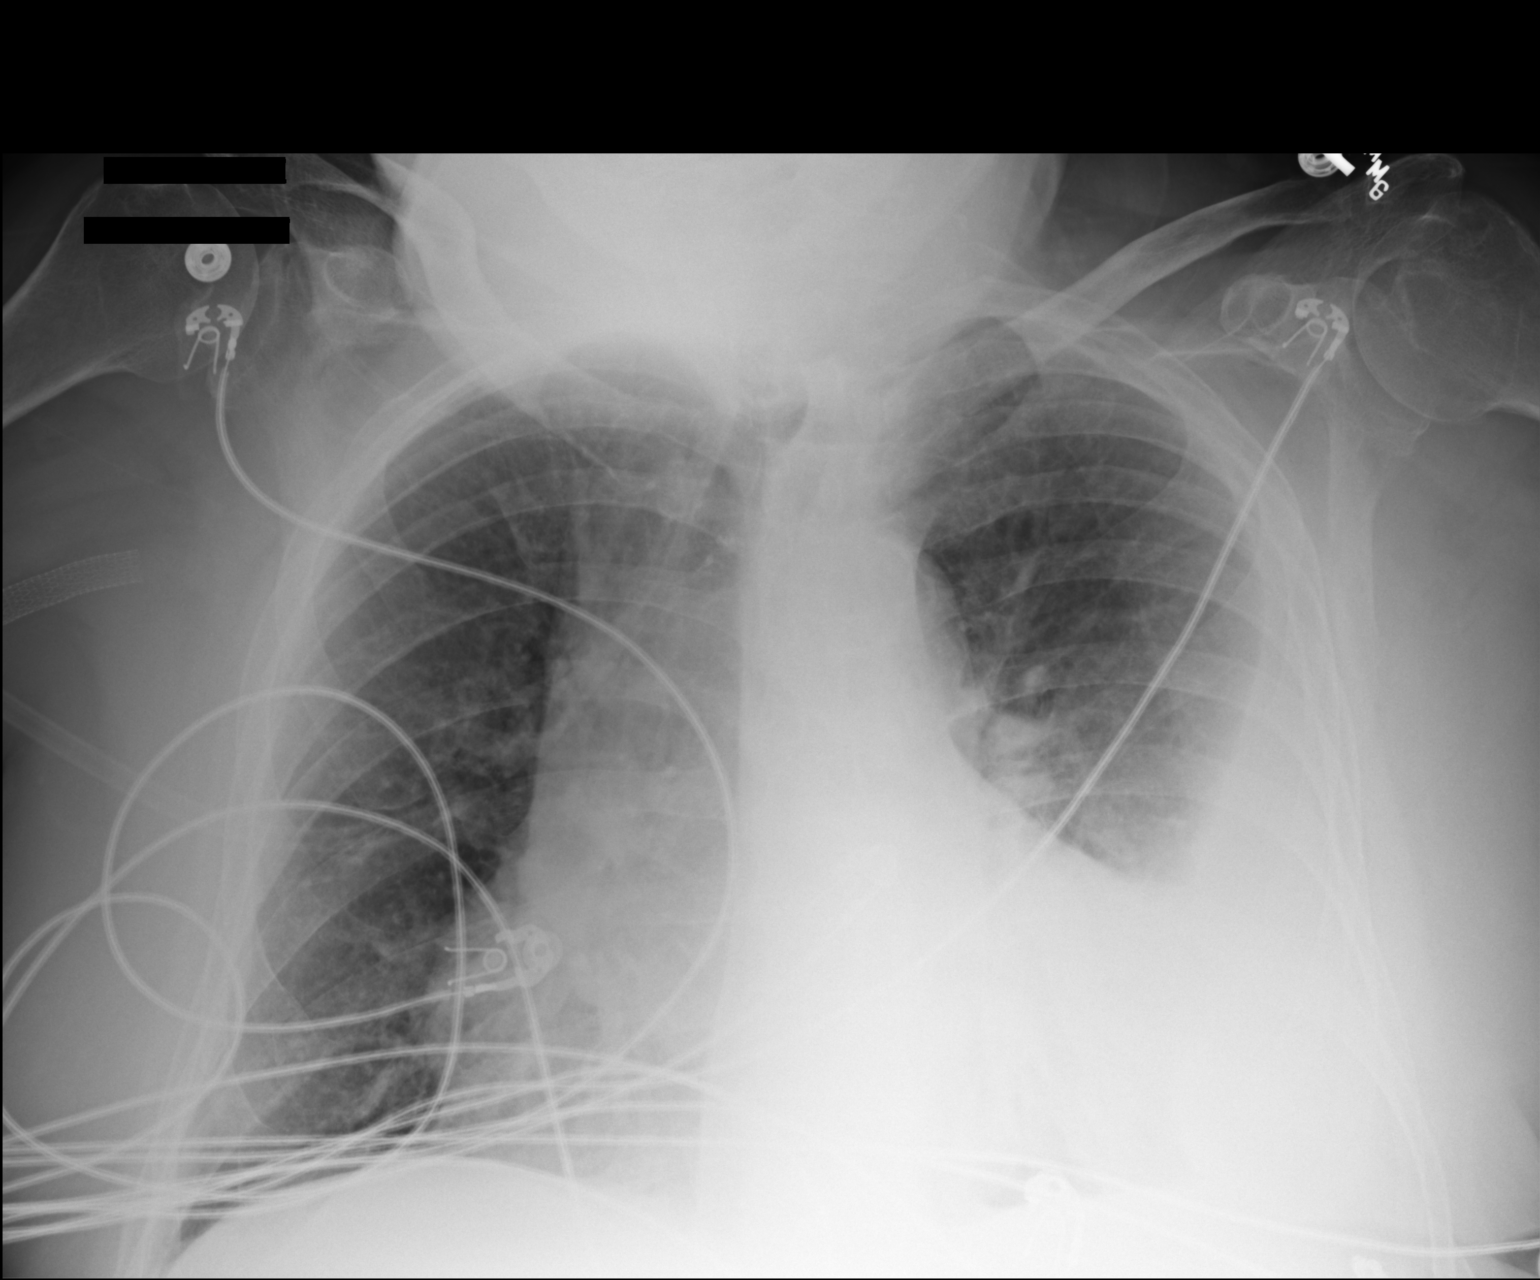

[1 of 1 positions shown; findings below may reference images not displayed]

FINDINGS: There is a moderate left pleural effusion which does not appear
free-flowing.

Chronic cardiomegaly. No pulmonary edema in the non obscured lungs.
No pneumothorax.
IMPRESSION: 1. Moderate left pleural effusion which appears partly loculated.
2. Chronic cardiomegaly.

## 2016-05-06 IMAGING — DX DG CHEST 1V
1 series · 1 of 1 positions shown · non-contrast
Comparison: 11/05/2014

CLINICAL DATA: Shortness of breath, history end-stage renal disease
on dialysis, COPD, diabetes, hypertension, CHF, hyperlipidemia

EXAM:
CHEST  1 VIEW

[chest ap]
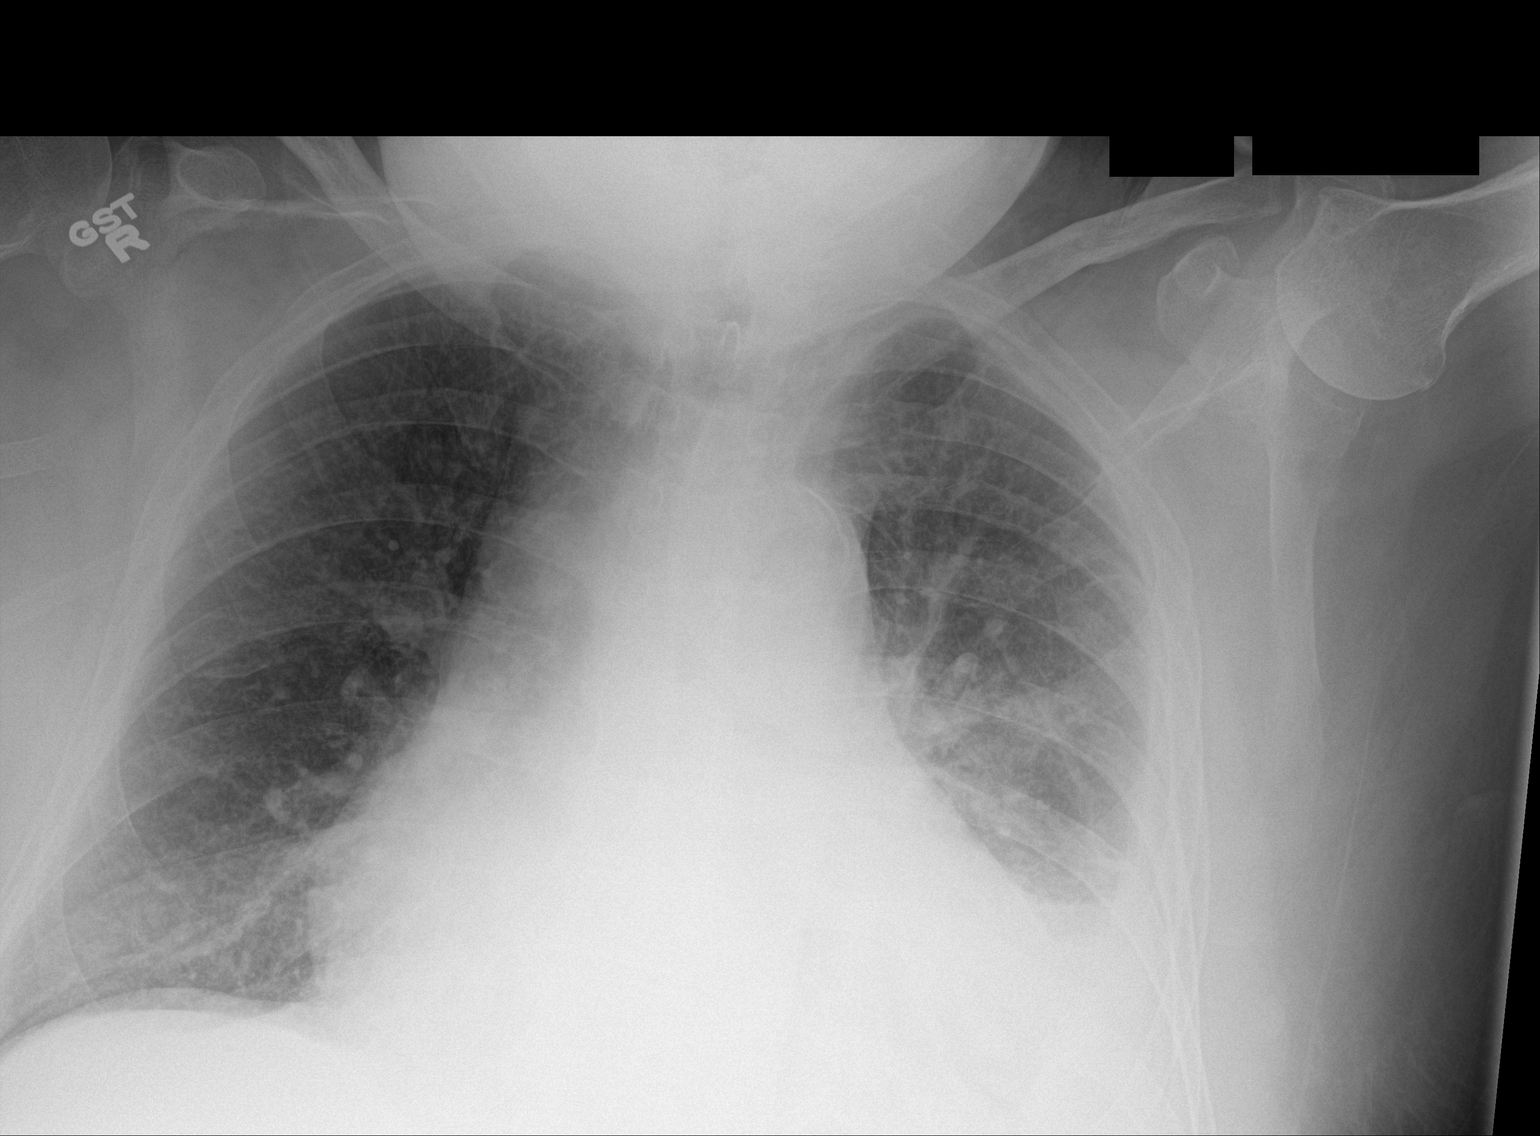

[1 of 1 positions shown; findings below may reference images not displayed]

FINDINGS: Enlargement of cardiac silhouette with pulmonary vascular
congestion.

Atherosclerotic calcification aorta.

LEFT pleural effusion and basilar atelectasis, pleural effusion
appearing decreased since previous exam.

No pneumothorax.

RIGHT lung clear.

Bones unremarkable.
IMPRESSION: Enlargement of cardiac silhouette with pulmonary vascular
congestion.

Decreased LEFT pleural effusion atelectasis.

## 2016-05-06 IMAGING — US US THORACENTESIS ASP PLEURAL SPACE W/IMG GUIDE
1 series · 5 of 5 positions shown · non-contrast
Comparison: Chest CT 11/06/14.

MEDICATIONS:
None

COMPLICATIONS:
None immediate

INDICATION: Symptomatic left sided pleural effusion

EXAM:
US THORACENTESIS ASP PLEURAL SPACE W/IMG GUIDE
TECHNIQUE: Informed written consent was obtained from the patient after a
discussion of the risks, benefits and alternatives to treatment. A
timeout was performed prior to the initiation of the procedure.

[Series 1: us thoracentesis asp pleural space w/img guide · 0.27mm/px · 5 of 5 slices shown]
[im 1/5]
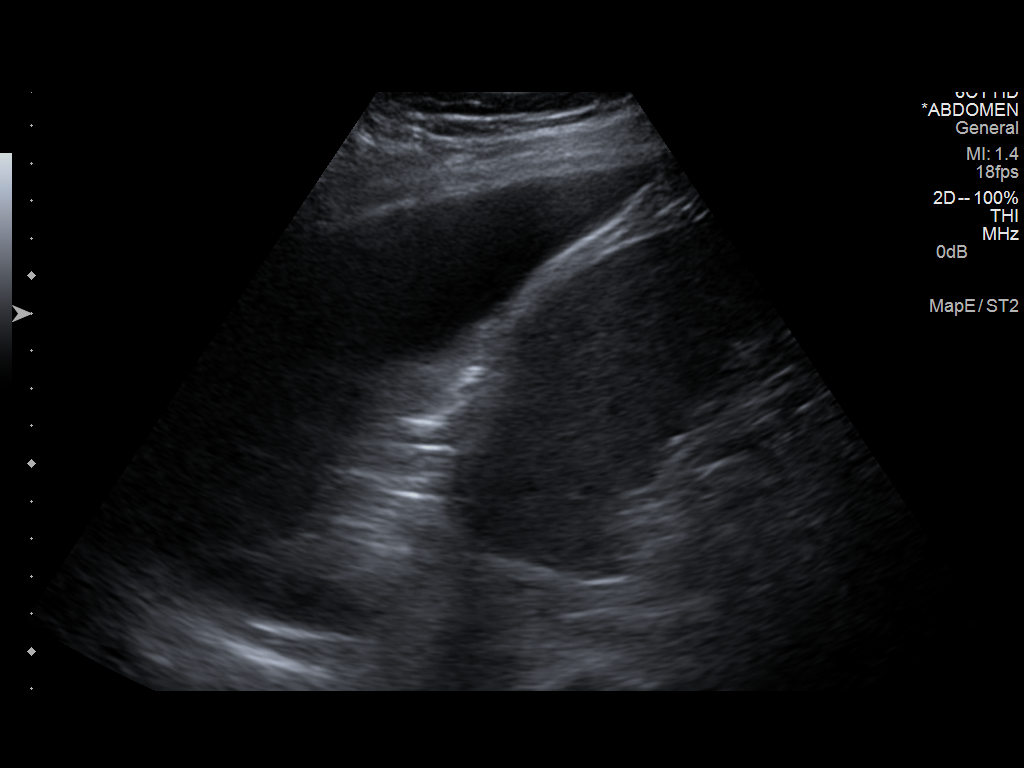
[im 2/5]
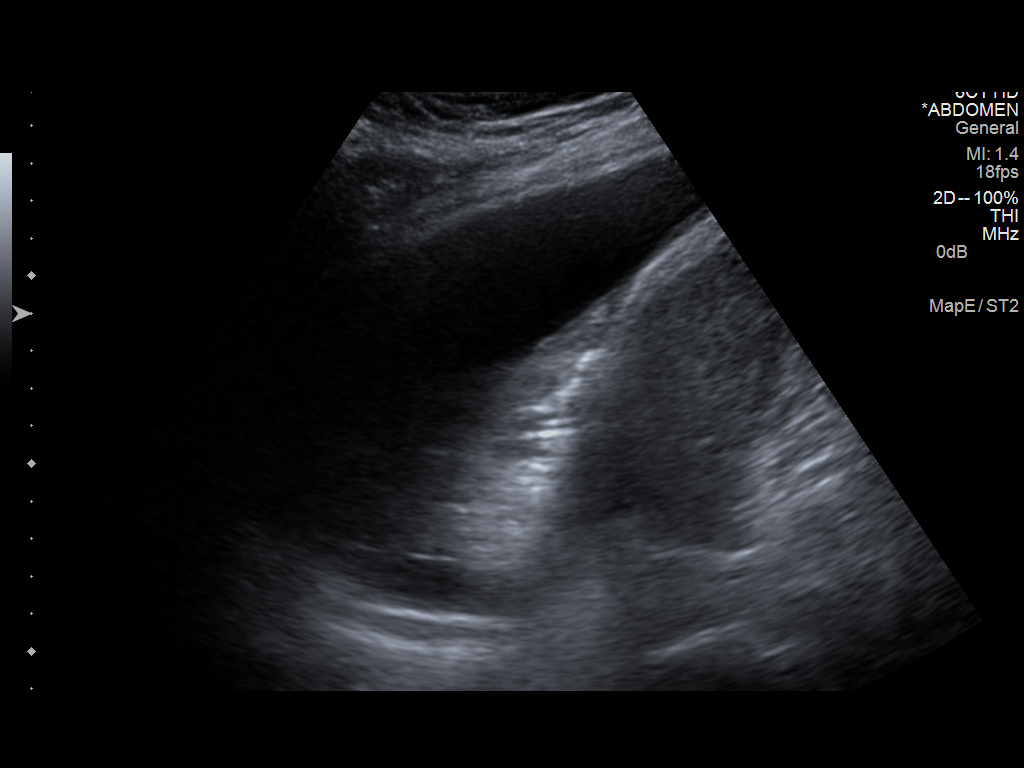
[im 3/5]
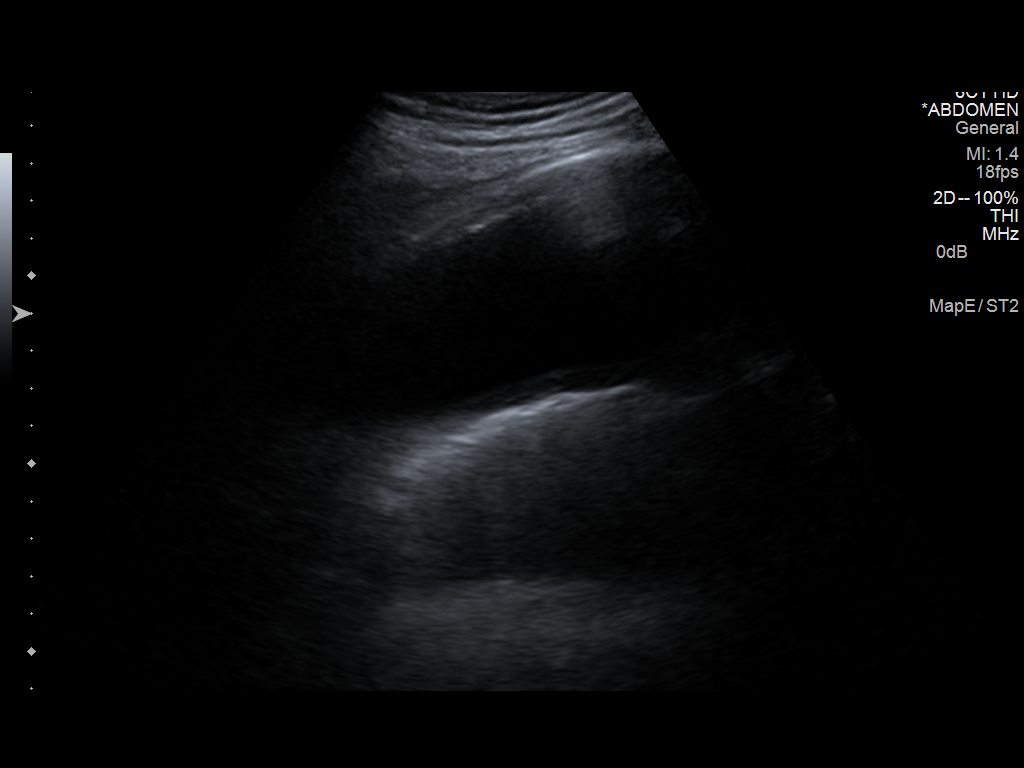
[im 4/5]
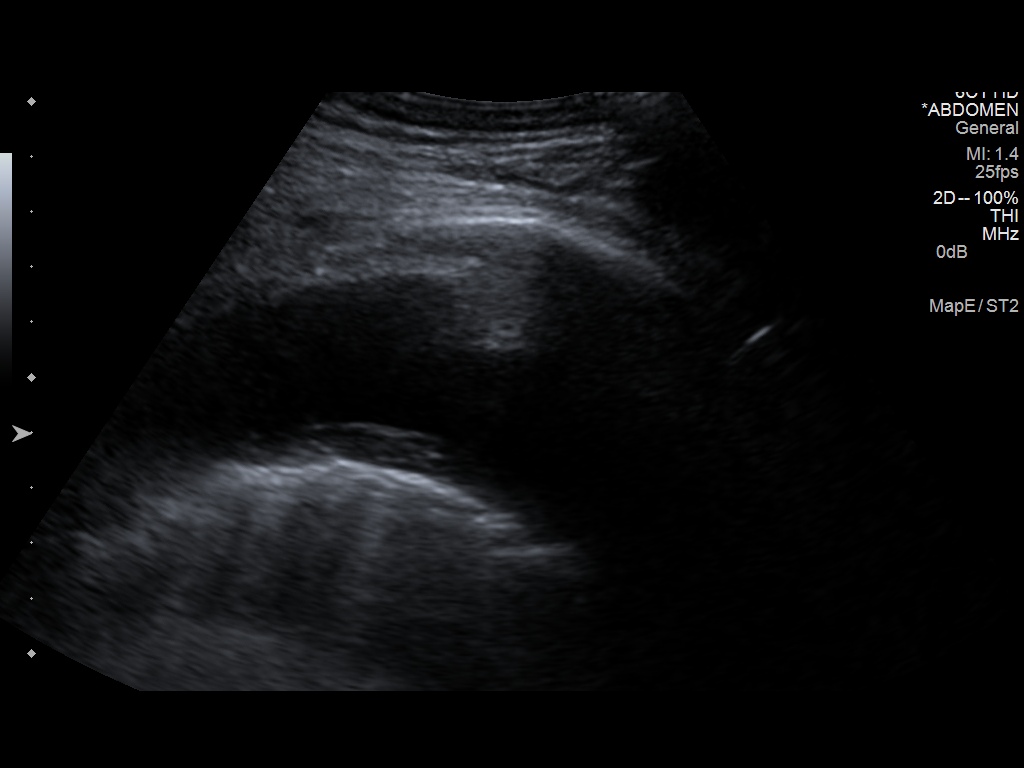
[im 5/5]
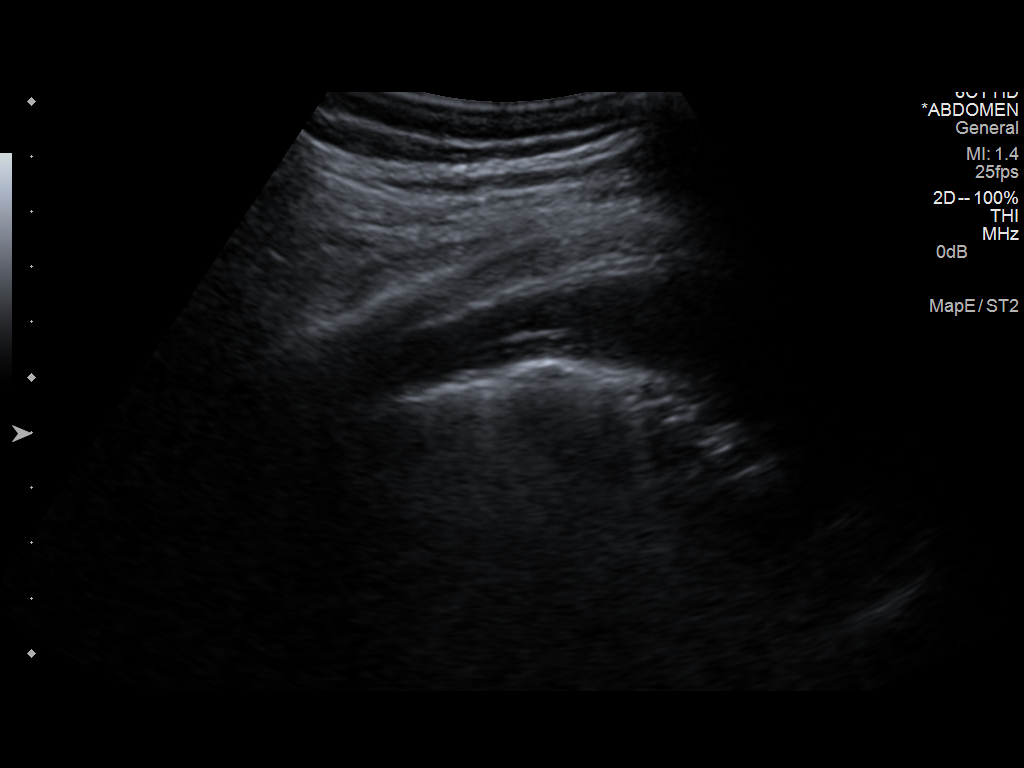

[5 of 5 positions shown; findings below may reference images not displayed]

Initial ultrasound scanning demonstrates a left pleural effusion.
The lower chest was prepped and draped in the usual sterile fashion.
1% lidocaine was used for local anesthesia.

Under direct ultrasound guidance, a 19 gauge, 10-cm, Yueh catheter
was introduced. An ultrasound image was saved for documentation
purposes. The thoracentesis was performed. The catheter was removed
and a dressing was applied. The patient tolerated the procedure well
without immediate post procedural complication. The patient was
escorted to have an upright chest radiograph.
FINDINGS: A total of approximately 700 ml of dark bloody fluid was removed.
Requested samples were sent to the laboratory.
IMPRESSION: Successful ultrasound-guided left sided thoracentesis yielding 700
ml of pleural fluid.

## 2016-05-06 IMAGING — CR DG CHEST 1V
1 series · 1 of 1 positions shown · non-contrast
Comparison: Chest CT 2722 hours today and earlier.

CLINICAL DATA: 74-year-old male status post left chest
thoracentesis. Initial encounter.

EXAM:
CHEST  1 VIEW

[chest ap]
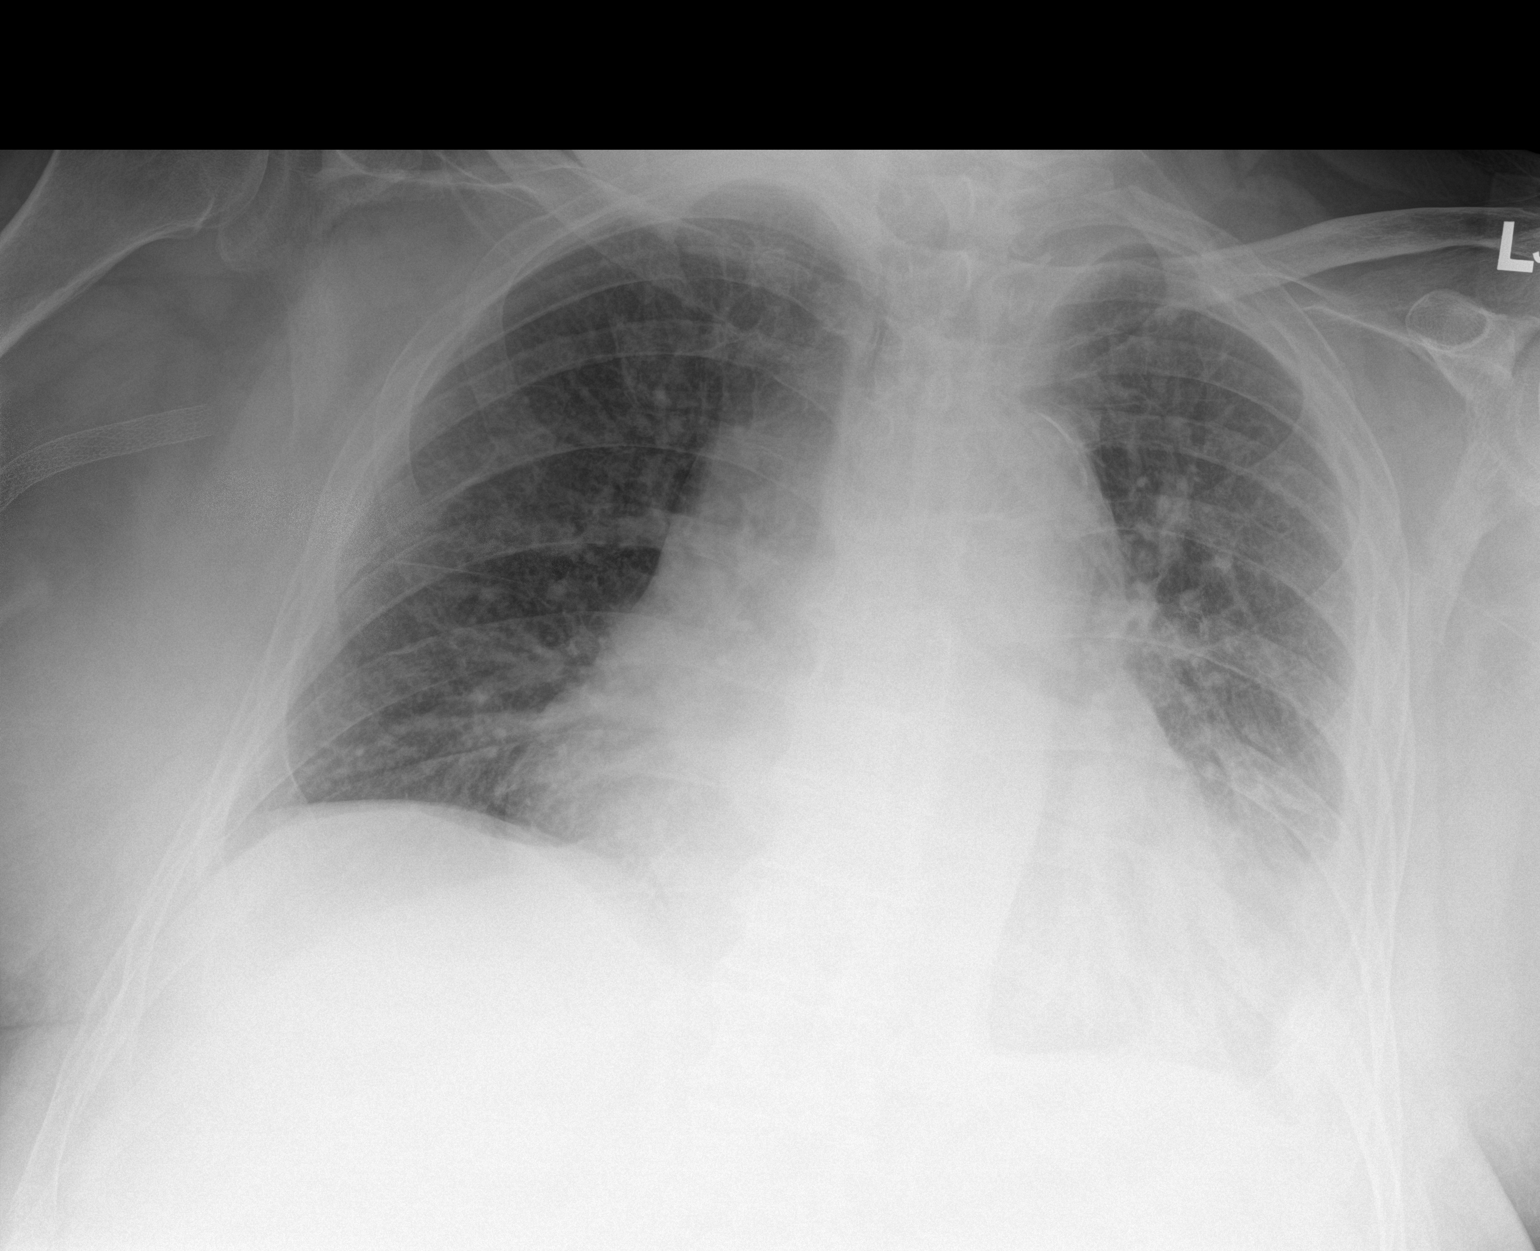

[1 of 1 positions shown; findings below may reference images not displayed]

FINDINGS: Portable AP semi upright view at 4011 hours. Decreased left pleural
effusion and improved left lung ventilation. No pneumothorax
identified. Stable cardiac size and mediastinal contours. Stable
right lung. Right axillary vascular stent re- identified.
IMPRESSION: Decreased left pleural effusion, improved ventilation, and no
pneumothorax identified following left side thoracentesis.
